# Patient Record
Sex: Male | Born: 1975 | Race: White | Hispanic: No | Marital: Married | Smoking: Former smoker
Health system: Southern US, Community
[De-identification: ages and names within clinical notes are randomized; demographics above are authoritative.]

## PROBLEM LIST (undated history)

## (undated) DIAGNOSIS — I509 Heart failure, unspecified: Secondary | ICD-10-CM

## (undated) DIAGNOSIS — I2510 Atherosclerotic heart disease of native coronary artery without angina pectoris: Secondary | ICD-10-CM

## (undated) DIAGNOSIS — E785 Hyperlipidemia, unspecified: Secondary | ICD-10-CM

## (undated) DIAGNOSIS — E119 Type 2 diabetes mellitus without complications: Secondary | ICD-10-CM

## (undated) DIAGNOSIS — K219 Gastro-esophageal reflux disease without esophagitis: Secondary | ICD-10-CM

## (undated) HISTORY — DX: Morbid (severe) obesity due to excess calories: E66.01

## (undated) HISTORY — PX: CORONARY ANGIOPLASTY WITH STENT PLACEMENT: SHX49

## (undated) HISTORY — DX: Hyperlipidemia, unspecified: E78.5

## (undated) HISTORY — DX: Type 2 diabetes mellitus without complications: E11.9

## (undated) HISTORY — DX: Gastro-esophageal reflux disease without esophagitis: K21.9

## (undated) HISTORY — DX: Atherosclerotic heart disease of native coronary artery without angina pectoris: I25.10

## (undated) HISTORY — DX: Heart failure, unspecified: I50.9

---

## 1997-12-13 ENCOUNTER — Emergency Department (HOSPITAL_COMMUNITY): Admission: EM | Admit: 1997-12-13 | Discharge: 1997-12-13 | Payer: Self-pay | Admitting: Emergency Medicine

## 1999-01-25 ENCOUNTER — Emergency Department (HOSPITAL_COMMUNITY): Admission: EM | Admit: 1999-01-25 | Discharge: 1999-01-25 | Payer: Self-pay | Admitting: Emergency Medicine

## 1999-01-25 ENCOUNTER — Encounter: Payer: Self-pay | Admitting: *Deleted

## 2000-05-08 ENCOUNTER — Encounter: Payer: Self-pay | Admitting: Emergency Medicine

## 2000-05-08 ENCOUNTER — Emergency Department (HOSPITAL_COMMUNITY): Admission: EM | Admit: 2000-05-08 | Discharge: 2000-05-08 | Payer: Self-pay | Admitting: Emergency Medicine

## 2001-09-02 ENCOUNTER — Emergency Department (HOSPITAL_COMMUNITY): Admission: EM | Admit: 2001-09-02 | Discharge: 2001-09-02 | Payer: Self-pay | Admitting: *Deleted

## 2003-11-07 ENCOUNTER — Emergency Department (HOSPITAL_COMMUNITY): Admission: AD | Admit: 2003-11-07 | Discharge: 2003-11-07 | Payer: Self-pay | Admitting: Family Medicine

## 2004-05-14 ENCOUNTER — Emergency Department (HOSPITAL_COMMUNITY): Admission: EM | Admit: 2004-05-14 | Discharge: 2004-05-14 | Payer: Self-pay | Admitting: Family Medicine

## 2004-10-29 ENCOUNTER — Emergency Department (HOSPITAL_COMMUNITY): Admission: EM | Admit: 2004-10-29 | Discharge: 2004-10-29 | Payer: Self-pay | Admitting: Family Medicine

## 2005-07-02 ENCOUNTER — Ambulatory Visit: Payer: Self-pay | Admitting: Family Medicine

## 2006-07-18 ENCOUNTER — Emergency Department (HOSPITAL_COMMUNITY): Admission: EM | Admit: 2006-07-18 | Discharge: 2006-07-18 | Payer: Self-pay | Admitting: Emergency Medicine

## 2007-06-23 ENCOUNTER — Emergency Department (HOSPITAL_COMMUNITY): Admission: EM | Admit: 2007-06-23 | Discharge: 2007-06-23 | Payer: Self-pay | Admitting: Emergency Medicine

## 2007-07-10 ENCOUNTER — Emergency Department (HOSPITAL_COMMUNITY): Admission: EM | Admit: 2007-07-10 | Discharge: 2007-07-10 | Payer: Self-pay | Admitting: Emergency Medicine

## 2007-09-03 ENCOUNTER — Ambulatory Visit: Payer: Self-pay | Admitting: Cardiology

## 2007-09-04 ENCOUNTER — Inpatient Hospital Stay (HOSPITAL_COMMUNITY): Admission: EM | Admit: 2007-09-04 | Discharge: 2007-09-05 | Payer: Self-pay | Admitting: Emergency Medicine

## 2007-09-04 ENCOUNTER — Ambulatory Visit: Payer: Self-pay | Admitting: Cardiology

## 2007-09-04 ENCOUNTER — Ambulatory Visit: Payer: Self-pay | Admitting: Internal Medicine

## 2007-09-04 ENCOUNTER — Encounter: Payer: Self-pay | Admitting: Internal Medicine

## 2007-09-05 ENCOUNTER — Encounter: Payer: Self-pay | Admitting: Internal Medicine

## 2007-09-08 ENCOUNTER — Telehealth: Payer: Self-pay | Admitting: Internal Medicine

## 2007-09-09 ENCOUNTER — Encounter: Payer: Self-pay | Admitting: Internal Medicine

## 2007-09-18 ENCOUNTER — Ambulatory Visit: Payer: Self-pay | Admitting: Internal Medicine

## 2007-09-21 ENCOUNTER — Ambulatory Visit: Payer: Self-pay | Admitting: Internal Medicine

## 2007-09-21 LAB — CONVERTED CEMR LAB
BUN: 12 mg/dL (ref 6–23)
CO2: 26 meq/L (ref 19–32)
GFR calc Af Amer: 145 mL/min
GFR calc non Af Amer: 120 mL/min
Glucose, Bld: 279 mg/dL — ABNORMAL HIGH (ref 70–99)
Potassium: 4.3 meq/L (ref 3.5–5.1)

## 2007-09-30 ENCOUNTER — Ambulatory Visit: Payer: Self-pay | Admitting: Internal Medicine

## 2007-09-30 ENCOUNTER — Ambulatory Visit (HOSPITAL_COMMUNITY): Admission: RE | Admit: 2007-09-30 | Discharge: 2007-09-30 | Payer: Self-pay | Admitting: Internal Medicine

## 2007-10-14 ENCOUNTER — Ambulatory Visit: Payer: Self-pay | Admitting: Internal Medicine

## 2007-10-14 LAB — CONVERTED CEMR LAB
BUN: 11 mg/dL (ref 6–23)
Basophils Absolute: 0.1 10*3/uL (ref 0.0–0.1)
Eosinophils Absolute: 0.2 10*3/uL (ref 0.0–0.6)
GFR calc Af Amer: 127 mL/min
GFR calc non Af Amer: 105 mL/min
Hemoglobin: 16.6 g/dL (ref 13.0–17.0)
Lymphocytes Relative: 28.4 % (ref 12.0–46.0)
MCHC: 34 g/dL (ref 30.0–36.0)
Monocytes Absolute: 0.6 10*3/uL (ref 0.2–0.7)
Monocytes Relative: 8.9 % (ref 3.0–11.0)
Neutro Abs: 4 10*3/uL (ref 1.4–7.7)
Potassium: 4 meq/L (ref 3.5–5.1)
aPTT: 28 s (ref 21.7–29.8)

## 2007-10-23 ENCOUNTER — Inpatient Hospital Stay (HOSPITAL_BASED_OUTPATIENT_CLINIC_OR_DEPARTMENT_OTHER): Admission: RE | Admit: 2007-10-23 | Discharge: 2007-10-23 | Payer: Self-pay | Admitting: Internal Medicine

## 2007-10-23 ENCOUNTER — Ambulatory Visit: Payer: Self-pay | Admitting: Internal Medicine

## 2007-10-23 ENCOUNTER — Inpatient Hospital Stay (HOSPITAL_COMMUNITY): Admission: AD | Admit: 2007-10-23 | Discharge: 2007-10-25 | Payer: Self-pay | Admitting: Internal Medicine

## 2007-10-27 ENCOUNTER — Ambulatory Visit: Payer: Self-pay | Admitting: Surgery

## 2007-11-12 ENCOUNTER — Ambulatory Visit: Payer: Self-pay | Admitting: Internal Medicine

## 2007-11-12 LAB — CONVERTED CEMR LAB
Basophils Absolute: 0 10*3/uL (ref 0.0–0.1)
CO2: 28 meq/L (ref 19–32)
Calcium: 8.9 mg/dL (ref 8.4–10.5)
GFR calc Af Amer: 145 mL/min
Hemoglobin: 16.8 g/dL (ref 13.0–17.0)
INR: 1 (ref 0.8–1.0)
Lymphocytes Relative: 29.9 % (ref 12.0–46.0)
MCHC: 34.2 g/dL (ref 30.0–36.0)
Neutro Abs: 3.4 10*3/uL (ref 1.4–7.7)
RDW: 11.9 % (ref 11.5–14.6)
Sodium: 134 meq/L — ABNORMAL LOW (ref 135–145)
aPTT: 25.9 s (ref 21.7–29.8)

## 2007-11-19 ENCOUNTER — Ambulatory Visit: Payer: Self-pay | Admitting: Cardiovascular Disease

## 2007-11-19 ENCOUNTER — Inpatient Hospital Stay (HOSPITAL_COMMUNITY): Admission: AD | Admit: 2007-11-19 | Discharge: 2007-11-20 | Payer: Self-pay | Admitting: Cardiovascular Disease

## 2007-12-07 ENCOUNTER — Encounter: Payer: Self-pay | Admitting: Internal Medicine

## 2007-12-31 ENCOUNTER — Ambulatory Visit: Payer: Self-pay | Admitting: Internal Medicine

## 2008-01-30 ENCOUNTER — Emergency Department (HOSPITAL_COMMUNITY): Admission: EM | Admit: 2008-01-30 | Discharge: 2008-01-30 | Payer: Self-pay | Admitting: Emergency Medicine

## 2008-02-01 ENCOUNTER — Emergency Department (HOSPITAL_COMMUNITY): Admission: EM | Admit: 2008-02-01 | Discharge: 2008-02-01 | Payer: Self-pay | Admitting: Family Medicine

## 2008-04-27 ENCOUNTER — Ambulatory Visit: Payer: Self-pay | Admitting: Internal Medicine

## 2008-04-27 ENCOUNTER — Encounter: Payer: Self-pay | Admitting: Internal Medicine

## 2008-04-27 LAB — CONVERTED CEMR LAB
Basophils Absolute: 0.1 10*3/uL (ref 0.0–0.1)
Bilirubin, Direct: 0.1 mg/dL (ref 0.0–0.3)
Calcium: 9.4 mg/dL (ref 8.4–10.5)
Cholesterol: 159 mg/dL (ref 0–200)
Direct LDL: 91.5 mg/dL
Eosinophils Absolute: 0.4 10*3/uL (ref 0.0–0.7)
GFR calc Af Amer: 169 mL/min
HCT: 48 % (ref 39.0–52.0)
Hemoglobin: 17.1 g/dL — ABNORMAL HIGH (ref 13.0–17.0)
MCHC: 35.7 g/dL (ref 30.0–36.0)
MCV: 96.1 fL (ref 78.0–100.0)
Monocytes Absolute: 0.5 10*3/uL (ref 0.1–1.0)
Neutro Abs: 4.6 10*3/uL (ref 1.4–7.7)
RDW: 11.7 % (ref 11.5–14.6)
Sodium: 138 meq/L (ref 135–145)
Total Bilirubin: 0.8 mg/dL (ref 0.3–1.2)
VLDL: 77 mg/dL — ABNORMAL HIGH (ref 0–40)

## 2008-07-18 ENCOUNTER — Ambulatory Visit: Payer: Self-pay | Admitting: Internal Medicine

## 2008-08-03 ENCOUNTER — Ambulatory Visit: Payer: Self-pay

## 2008-08-03 ENCOUNTER — Encounter: Payer: Self-pay | Admitting: Internal Medicine

## 2008-08-03 ENCOUNTER — Ambulatory Visit: Payer: Self-pay | Admitting: Internal Medicine

## 2008-08-03 LAB — CONVERTED CEMR LAB
ALT: 39 units/L (ref 0–53)
Albumin: 3.8 g/dL (ref 3.5–5.2)
BUN: 9 mg/dL (ref 6–23)
CO2: 26 meq/L (ref 19–32)
Calcium: 9.1 mg/dL (ref 8.4–10.5)
Cholesterol: 172 mg/dL (ref 0–200)
Creatinine, Ser: 0.8 mg/dL (ref 0.4–1.5)
Direct LDL: 109.7 mg/dL
GFR calc non Af Amer: 120 mL/min
Hgb A1c MFr Bld: 9.9 % — ABNORMAL HIGH (ref 4.6–6.0)
Total Bilirubin: 0.8 mg/dL (ref 0.3–1.2)
Total CHOL/HDL Ratio: 6.3
Triglycerides: 213 mg/dL (ref 0–149)
VLDL: 43 mg/dL — ABNORMAL HIGH (ref 0–40)

## 2008-08-04 ENCOUNTER — Encounter: Payer: Self-pay | Admitting: Internal Medicine

## 2008-09-05 ENCOUNTER — Telehealth: Payer: Self-pay | Admitting: Internal Medicine

## 2008-10-07 DIAGNOSIS — E785 Hyperlipidemia, unspecified: Secondary | ICD-10-CM | POA: Insufficient documentation

## 2008-10-07 DIAGNOSIS — I120 Hypertensive chronic kidney disease with stage 5 chronic kidney disease or end stage renal disease: Secondary | ICD-10-CM

## 2008-10-07 DIAGNOSIS — I2510 Atherosclerotic heart disease of native coronary artery without angina pectoris: Secondary | ICD-10-CM | POA: Insufficient documentation

## 2008-10-07 DIAGNOSIS — I5022 Chronic systolic (congestive) heart failure: Secondary | ICD-10-CM

## 2008-12-07 ENCOUNTER — Encounter: Payer: Self-pay | Admitting: Internal Medicine

## 2008-12-07 ENCOUNTER — Ambulatory Visit: Payer: Self-pay | Admitting: Internal Medicine

## 2008-12-07 DIAGNOSIS — I10 Essential (primary) hypertension: Secondary | ICD-10-CM | POA: Insufficient documentation

## 2008-12-07 DIAGNOSIS — F329 Major depressive disorder, single episode, unspecified: Secondary | ICD-10-CM

## 2008-12-08 LAB — CONVERTED CEMR LAB
ALT: 40 units/L (ref 0–53)
AST: 31 units/L (ref 0–37)
Albumin: 4 g/dL (ref 3.5–5.2)
Alkaline Phosphatase: 70 units/L (ref 39–117)
BUN: 11 mg/dL (ref 6–23)
Bilirubin, Direct: 0 mg/dL (ref 0.0–0.3)
Calcium: 9.2 mg/dL (ref 8.4–10.5)
Cholesterol: 138 mg/dL (ref 0–200)
Creatinine, Ser: 0.7 mg/dL (ref 0.4–1.5)
GFR calc non Af Amer: 138.68 mL/min (ref >60)
Glucose, Bld: 284 mg/dL — ABNORMAL HIGH (ref 70–99)
Potassium: 3.8 meq/L (ref 3.5–5.1)
Total Protein: 7.3 g/dL (ref 6.0–8.3)
VLDL: 60 mg/dL — ABNORMAL HIGH (ref 0.0–40.0)

## 2009-01-02 ENCOUNTER — Telehealth: Payer: Self-pay | Admitting: Internal Medicine

## 2009-04-11 ENCOUNTER — Ambulatory Visit: Payer: Self-pay | Admitting: Internal Medicine

## 2009-07-06 ENCOUNTER — Telehealth: Payer: Self-pay | Admitting: Internal Medicine

## 2009-10-09 ENCOUNTER — Emergency Department (HOSPITAL_COMMUNITY): Admission: EM | Admit: 2009-10-09 | Discharge: 2009-10-09 | Payer: Self-pay | Admitting: Emergency Medicine

## 2009-11-10 ENCOUNTER — Ambulatory Visit: Payer: Self-pay | Admitting: Internal Medicine

## 2009-12-04 ENCOUNTER — Encounter: Payer: Self-pay | Admitting: Internal Medicine

## 2009-12-04 ENCOUNTER — Ambulatory Visit: Payer: Self-pay | Admitting: Cardiology

## 2009-12-04 ENCOUNTER — Ambulatory Visit: Payer: Self-pay | Admitting: Internal Medicine

## 2009-12-04 ENCOUNTER — Ambulatory Visit (HOSPITAL_COMMUNITY): Admission: RE | Admit: 2009-12-04 | Discharge: 2009-12-04 | Payer: Self-pay | Admitting: Internal Medicine

## 2009-12-04 ENCOUNTER — Ambulatory Visit: Payer: Self-pay

## 2009-12-11 ENCOUNTER — Telehealth: Payer: Self-pay | Admitting: Internal Medicine

## 2009-12-12 LAB — CONVERTED CEMR LAB
Albumin: 3.5 g/dL (ref 3.5–5.2)
BUN: 10 mg/dL (ref 6–23)
Calcium: 9.2 mg/dL (ref 8.4–10.5)
Cholesterol: 201 mg/dL — ABNORMAL HIGH (ref 0–200)
Creatinine, Ser: 0.7 mg/dL (ref 0.4–1.5)
GFR calc non Af Amer: 137.83 mL/min (ref >60)
Glucose, Bld: 386 mg/dL — ABNORMAL HIGH (ref 70–99)
Total Bilirubin: 0.4 mg/dL (ref 0.3–1.2)
Total CHOL/HDL Ratio: 7
Triglycerides: 805 mg/dL — ABNORMAL HIGH (ref 0.0–149.0)
VLDL: 161 mg/dL — ABNORMAL HIGH (ref 0.0–40.0)

## 2010-06-15 ENCOUNTER — Encounter: Payer: Self-pay | Admitting: Cardiology

## 2010-06-18 ENCOUNTER — Ambulatory Visit: Payer: Self-pay | Admitting: Cardiology

## 2010-06-18 ENCOUNTER — Encounter: Payer: Self-pay | Admitting: Physician Assistant

## 2010-06-18 DIAGNOSIS — R109 Unspecified abdominal pain: Secondary | ICD-10-CM

## 2010-06-18 DIAGNOSIS — E1149 Type 2 diabetes mellitus with other diabetic neurological complication: Secondary | ICD-10-CM | POA: Insufficient documentation

## 2010-06-19 LAB — CONVERTED CEMR LAB
ALT: 25 units/L (ref 0–53)
AST: 22 units/L (ref 0–37)
Albumin: 3.6 g/dL (ref 3.5–5.2)
BUN: 10 mg/dL (ref 6–23)
Cholesterol: 235 mg/dL — ABNORMAL HIGH (ref 0–200)
Creatinine, Ser: 0.6 mg/dL (ref 0.4–1.5)
GFR calc non Af Amer: 158.04 mL/min (ref >60)
Glucose, Bld: 321 mg/dL — ABNORMAL HIGH (ref 70–99)
Potassium: 3.9 meq/L (ref 3.5–5.1)
Total Bilirubin: 1.1 mg/dL (ref 0.3–1.2)
Total CHOL/HDL Ratio: 8
Triglycerides: 490 mg/dL — ABNORMAL HIGH (ref 0.0–149.0)
VLDL: 98 mg/dL — ABNORMAL HIGH (ref 0.0–40.0)

## 2010-06-27 ENCOUNTER — Ambulatory Visit: Payer: Self-pay | Admitting: Internal Medicine

## 2010-06-27 DIAGNOSIS — A630 Anogenital (venereal) warts: Secondary | ICD-10-CM

## 2010-06-27 DIAGNOSIS — E8881 Metabolic syndrome: Secondary | ICD-10-CM

## 2010-06-27 LAB — CONVERTED CEMR LAB
ALT: 26 units/L (ref 0–53)
AST: 18 units/L (ref 0–37)
Albumin: 3.9 g/dL (ref 3.5–5.2)
Basophils Relative: 1.3 % (ref 0.0–3.0)
Bilirubin Urine: NEGATIVE
Chloride: 101 meq/L (ref 96–112)
Cholesterol, target level: 200 mg/dL
Eosinophils Relative: 3.2 % (ref 0.0–5.0)
GFR calc non Af Amer: 170.66 mL/min (ref >60)
HCT: 49.9 % (ref 39.0–52.0)
Hemoglobin: 17.9 g/dL — ABNORMAL HIGH (ref 13.0–17.0)
LDL Goal: 70 mg/dL
Lymphs Abs: 1.8 10*3/uL (ref 0.7–4.0)
MCV: 94.9 fL (ref 78.0–100.0)
Monocytes Absolute: 0.5 10*3/uL (ref 0.1–1.0)
Monocytes Relative: 6.5 % (ref 3.0–12.0)
Neutro Abs: 4.9 10*3/uL (ref 1.4–7.7)
Nitrite: NEGATIVE
Potassium: 4.4 meq/L (ref 3.5–5.1)
Sodium: 135 meq/L (ref 135–145)
TSH: 0.58 microintl units/mL (ref 0.35–5.50)
Total Bilirubin: 1 mg/dL (ref 0.3–1.2)
Total Protein, Urine: 30 mg/dL
Total Protein: 6.8 g/dL (ref 6.0–8.3)
Urine Glucose: >=1000 mg/dL
WBC: 7.5 10*3/uL (ref 4.5–10.5)
pH: 6.5 (ref 5.0–8.0)

## 2010-06-28 ENCOUNTER — Encounter: Payer: Self-pay | Admitting: Internal Medicine

## 2010-07-03 ENCOUNTER — Encounter: Payer: Self-pay | Admitting: Internal Medicine

## 2010-07-04 LAB — HM DIABETES EYE EXAM

## 2010-07-05 ENCOUNTER — Telehealth: Payer: Self-pay | Admitting: Internal Medicine

## 2010-07-17 ENCOUNTER — Telehealth: Payer: Self-pay | Admitting: Internal Medicine

## 2010-07-17 ENCOUNTER — Encounter: Payer: Self-pay | Admitting: Internal Medicine

## 2010-07-24 ENCOUNTER — Ambulatory Visit: Payer: Self-pay | Admitting: Internal Medicine

## 2010-07-24 DIAGNOSIS — J019 Acute sinusitis, unspecified: Secondary | ICD-10-CM

## 2010-08-23 ENCOUNTER — Other Ambulatory Visit: Payer: Self-pay | Admitting: Internal Medicine

## 2010-08-23 ENCOUNTER — Ambulatory Visit
Admission: RE | Admit: 2010-08-23 | Discharge: 2010-08-23 | Payer: Self-pay | Source: Home / Self Care | Attending: Internal Medicine | Admitting: Internal Medicine

## 2010-08-23 LAB — BASIC METABOLIC PANEL
BUN: 9 mg/dL (ref 6–23)
CO2: 26 mEq/L (ref 19–32)
Calcium: 9.2 mg/dL (ref 8.4–10.5)
Chloride: 101 mEq/L (ref 96–112)
Creatinine, Ser: 0.7 mg/dL (ref 0.4–1.5)
GFR: 137.24 mL/min (ref >60.00)
Glucose, Bld: 313 mg/dL — ABNORMAL HIGH (ref 70–99)
Potassium: 4.3 mEq/L (ref 3.5–5.1)
Sodium: 136 mEq/L (ref 135–145)

## 2010-08-23 LAB — LIPID PANEL
Cholesterol: 226 mg/dL — ABNORMAL HIGH (ref 0–200)
HDL: 31 mg/dL — ABNORMAL LOW (ref >39.00)
Total CHOL/HDL Ratio: 7
Triglycerides: 238 mg/dL — ABNORMAL HIGH (ref 0.0–149.0)
VLDL: 47.6 mg/dL — ABNORMAL HIGH (ref 0.0–40.0)

## 2010-08-23 LAB — HEMOGLOBIN A1C: Hgb A1c MFr Bld: 10.9 % — ABNORMAL HIGH (ref 4.6–6.5)

## 2010-08-23 LAB — LDL CHOLESTEROL, DIRECT: Direct LDL: 165.5 mg/dL

## 2010-08-24 ENCOUNTER — Telehealth: Payer: Self-pay | Admitting: Internal Medicine

## 2010-08-24 ENCOUNTER — Encounter: Payer: Self-pay | Admitting: Internal Medicine

## 2010-09-04 ENCOUNTER — Ambulatory Visit
Admission: RE | Admit: 2010-09-04 | Discharge: 2010-09-04 | Payer: Self-pay | Source: Home / Self Care | Attending: Internal Medicine | Admitting: Internal Medicine

## 2010-09-04 DIAGNOSIS — M79609 Pain in unspecified limb: Secondary | ICD-10-CM | POA: Insufficient documentation

## 2010-09-04 LAB — HM DIABETES FOOT EXAM

## 2010-09-06 ENCOUNTER — Telehealth: Payer: Self-pay | Admitting: Internal Medicine

## 2010-09-18 NOTE — Progress Notes (Signed)
Summary: question on meds  Phone Note Call from Patient Call back at [phone]   Caller: Patient Reason for Call: Talk to Nurse Summary of Call: question on meds Initial call taken by: Roe Coombs,  July 17, 2010 1:44 PM  Follow-up for Phone Call        when pt saw Tereso Newcomer Simvastatin was changed to Crestor and he can not afford, will apply for asst. program, also needs to reaply for Plavix asst. program, samples of crestor and plavix and applications all left at front desk for pt to complete, also needed a refill on ramipril sent to [facility] Follow-up by: Meredith Staggers, RN,  July 17, 2010 2:17 PM    Prescriptions: CRESTOR 20 MG TABS (ROSUVASTATIN CALCIUM) Take one tablet by mouth daily.  #35 x 0   Entered by:   Meredith Staggers, RN   Authorized by:   Dolores Patty, MD, [facility]   Signed by:   Meredith Staggers, RN on 07/17/2010   Method used:   Samples Given   RxID:   [id number] PLAVIX 75 MG TABS (CLOPIDOGREL BISULFATE) Take one tablet by mouth daily  #12 x 0   Entered by:   Meredith Staggers, RN   Authorized by:   Dolores Patty, MD, [facility]   Signed by:   Meredith Staggers, RN on 07/17/2010   Method used:   Samples Given   RxID:   [id number] ALTACE 10 MG CAPS (RAMIPRIL) 1 by mouth once daily  #30 x 12   Entered by:   Meredith Staggers, RN   Authorized by:   Dolores Patty, MD, [facility]   Signed by:   Meredith Staggers, RN on 07/17/2010   Method used:   Electronically to        [facility] [id number]* (retail)       [address]       [place], [place]  [address]       Ph: [phone]       Fax: [phone]   RxID:   [id number]   Appended Document: question on meds    Clinical Lists Changes  Medications: Rx of CRESTOR 20 MG TABS (ROSUVASTATIN CALCIUM) Take one tablet by mouth daily.;  #90 x 3;  Signed;  Entered by: Meredith Staggers, RN;  Authorized by: Dolores Patty, MD, [facility];  Method used: Printed then faxed to [facility]  [address]  [id number] [address], [place], [place], [place]  [address], Ph: [phone], Fax: [phone]    Prescriptions: CRESTOR 20 MG TABS (ROSUVASTATIN CALCIUM) Take one tablet by mouth daily.  #90 x 3   Entered by:   Meredith Staggers, RN   Authorized by:   Dolores Patty, MD, [facility]   Signed by:   Meredith Staggers, RN on 07/18/2010   Method used:   Printed then faxed to ...       [facility]  [id number] (retail)       [address]       [place], [place]  [address]       Ph: [phone]       Fax: [phone]   RxID:   [id number]  prescription sent to [facility] & me program Meredith Staggers, RN  July 18, 2010 5:06 PM

## 2010-09-18 NOTE — Letter (Signed)
Summary: [facility]  [facility]   Imported By: Lester Miranda 07/10/2010 07:26:27  _____________________________________________________________________  External Attachment:    Type:   Image     Comment:   External Document

## 2010-09-18 NOTE — Assessment & Plan Note (Signed)
Summary: cough/sore throat/cd   Vital Signs:  Patient profile:   35 year old male Height:      74 inches Weight:      301.25 pounds BMI:     38.82 O2 Sat:      98 % on Room air Temp:     98.4 degrees F oral Pulse rate:   80 / minute Pulse rhythm:   regular Resp:     16 per minute BP sitting:   110 / 84  (left arm) Cuff size:   large  Vitals Entered By: Rock Nephew CMA (July 24, 2010 9:01 AM)  Nutrition Counseling: Patient's BMI is greater than 25 and therefore counseled on weight management options.  O2 Flow:  Room air CC: Pt c/o cough, congestion, facial pain x 1wk, URI symptoms, Hypertension Management Is Patient Diabetic? Yes Did you bring your meter with you today? No Pain Assessment Patient in pain? no       Does patient need assistance? Functional Status Self care Ambulation Normal   Primary Care Provider:  Etta Grandchild MD  CC:  Pt c/o cough, congestion, facial pain x 1wk, URI symptoms, and Hypertension Management.  History of Present Illness:  URI Symptoms      This is a 35 year old man who presents with URI symptoms.  The symptoms began 2 weeks ago.  The severity is described as moderate.  The patient reports nasal congestion, purulent nasal discharge, and sore throat, but denies dry cough, productive cough, earache, and sick contacts.  The patient denies fever, stiff neck, dyspnea, wheezing, rash, vomiting, diarrhea, use of an antipyretic, and response to antipyretic.  The patient also reports severe fatigue.  The patient denies itchy throat, sneezing, headache, and muscle aches.  Risk factors for Strep sinusitis include unilateral facial pain, unilateral nasal discharge, poor response to decongestant, and double sickening.  The patient denies the following risk factors for Strep sinusitis: tooth pain, Strep exposure, tender adenopathy, and absence of cough.    Hypertension History:      He denies headache, chest pain, palpitations, dyspnea with  exertion, orthopnea, PND, peripheral edema, visual symptoms, neurologic problems, syncope, and side effects from treatment.  He notes no problems with any antihypertensive medication side effects.        Positive major cardiovascular risk factors include diabetes, hyperlipidemia, and hypertension.  Negative major cardiovascular risk factors include male age less than 72 years old, negative family history for ischemic heart disease, and non-tobacco-user status.        Positive history for target organ damage include ASHD (either angina/prior MI/prior CABG) and cardiac end organ damage (either CHF or LVH).  Further assessment for target organ damage reveals no history of stroke/TIA, peripheral vascular disease, renal insufficiency, or hypertensive retinopathy.     Preventive Screening-Counseling & Management  Alcohol-Tobacco     Alcohol drinks/day: 0     Alcohol Counseling: not indicated; patient does not drink     Smoking Status: quit < 6 months     Tobacco Counseling: to remain off tobacco products  Hep-HIV-STD-Contraception     Hepatitis Risk: no risk noted     HIV Risk: no risk noted     STD Risk: no risk noted     Dental Visit-last 6 months yes     Dental Care Counseling: to seek dental care; no dental care within six months     TSE monthly: yes     Testicular SE Education/Counseling to perform regular STE  Sun Exposure-Excessive: no      Sexual History:  currently monogamous.        Drug Use:  never.        Blood Transfusions:  no.    Clinical Review Panels:  Immunizations   Last Tetanus Booster:  Tdap (06/27/2010)   Last Flu Vaccine:  Fluvax 3+ (06/27/2010)  Lipid Management   Cholesterol:  235 (06/18/2010)   LDL (bad choesterol):  DEL (08/03/2008)   HDL (good cholesterol):  30.40 (06/18/2010)  Diabetes Management   HgBA1C:  11.4 (06/27/2010)   Creatinine:  0.6 (06/27/2010)   Last Dilated Eye Exam:  diabetic retinopathy (07/04/2010)   Last Foot Exam:  yes  (07/24/2010)   Last Flu Vaccine:  Fluvax 3+ (06/27/2010)  CBC   WBC:  7.5 (06/27/2010)   RBC:  5.26 (06/27/2010)   Hgb:  17.9 (06/27/2010)   Hct:  49.9 (06/27/2010)   Platelets:  214.0 (06/27/2010)   MCV  94.9 (06/27/2010)   MCHC  35.9 (06/27/2010)   RDW  12.1 (06/27/2010)   PMN:  64.6 (06/27/2010)   Lymphs:  24.4 (06/27/2010)   Monos:  6.5 (06/27/2010)   Eosinophils:  3.2 (06/27/2010)   Basophil:  1.3 (06/27/2010)  Complete Metabolic Panel   Glucose:  391 (06/27/2010)   Sodium:  135 (06/27/2010)   Potassium:  4.4 (06/27/2010)   Chloride:  101 (06/27/2010)   CO2:  26 (06/27/2010)   BUN:  11 (06/27/2010)   Creatinine:  0.6 (06/27/2010)   Albumin:  3.9 (06/27/2010)   Total Protein:  6.8 (06/27/2010)   Calcium:  10.0 (06/27/2010)   Total Bili:  1.0 (06/27/2010)   Alk Phos:  88 (06/27/2010)   SGPT (ALT):  26 (06/27/2010)   SGOT (AST):  18 (06/27/2010)   Medications Prior to Update: 1)  Carvedilol 25 Mg Tabs (Carvedilol) .... Take Two Tablets By Mouth Twice A Day 2)  Furosemide 40 Mg Tabs (Furosemide) .Marland Kitchen.. 1 Tab By Mouth Two Times A Day 3)  Altace 10 Mg Caps (Ramipril) .Marland Kitchen.. 1 By Mouth Once Daily 4)  Digoxin 0.25 Mg Tabs (Digoxin) .Marland Kitchen.. 1 Tab By Mouth Once Daily 5)  Klor-Con M20 20 Meq Cr-Tabs (Potassium Chloride Crys Cr) .Marland Kitchen.. 1 Tab By Mouth Once Daily 6)  Aspirin Ec 325 Mg Tbec (Aspirin) .... Take One Tablet By Mouth Daily 7)  Spironolactone 25 Mg Tabs (Spironolactone) .... Take 1/2 Tablet By Mouth Two Times A Day 8)  Plavix 75 Mg Tabs (Clopidogrel Bisulfate) .... Take One Tablet By Mouth Daily 9)  Tricor 145 Mg Tabs (Fenofibrate) .Marland Kitchen.. 1 Tab By Mouth At Bedtime 10)  Fish Oil   Oil (Fish Oil) .... 2000mg  Daily 11)  Allegra 180 Mg Tabs (Fexofenadine Hcl) .... As Needed 12)  Prevacid 15 Mg Cpdr (Lansoprazole) .... Once Daily As Needed 13)  Crestor 20 Mg Tabs (Rosuvastatin Calcium) .... Take One Tablet By Mouth Daily. 14)  Cymbalta 30 Mg Cpep (Duloxetine Hcl) .... One By Mouth  Once Daily For Depresion 15)  Contour Blood Glucose System W/device Kit (Blood Glucose Monitoring Suppl) .... Use Two Times A Day As Directed 16)  Levemir Flexpen 100 Unit/ml Soln (Insulin Detemir) .... 50 Units Once Daily  Current Medications (verified): 1)  Carvedilol 25 Mg Tabs (Carvedilol) .... Take Two Tablets By Mouth Twice A Day 2)  Furosemide 40 Mg Tabs (Furosemide) .Marland Kitchen.. 1 Tab By Mouth Two Times A Day 3)  Altace 10 Mg Caps (Ramipril) .Marland Kitchen.. 1 By Mouth Once Daily 4)  Digoxin 0.25 Mg  Tabs (Digoxin) .Marland Kitchen.. 1 Tab By Mouth Once Daily 5)  Klor-Con M20 20 Meq Cr-Tabs (Potassium Chloride Crys Cr) .Marland Kitchen.. 1 Tab By Mouth Once Daily 6)  Aspirin Ec 325 Mg Tbec (Aspirin) .... Take One Tablet By Mouth Daily 7)  Spironolactone 25 Mg Tabs (Spironolactone) .... Take 1/2 Tablet By Mouth Two Times A Day 8)  Plavix 75 Mg Tabs (Clopidogrel Bisulfate) .... Take One Tablet By Mouth Daily 9)  Fish Oil   Oil (Fish Oil) .... 2000mg  Daily 10)  Allegra 180 Mg Tabs (Fexofenadine Hcl) .... As Needed 11)  Prevacid 15 Mg Cpdr (Lansoprazole) .... Once Daily As Needed 12)  Crestor 20 Mg Tabs (Rosuvastatin Calcium) .... Take One Tablet By Mouth Daily. 13)  Cymbalta 30 Mg Cpep (Duloxetine Hcl) .... One By Mouth Once Daily For Depresion 14)  Contour Blood Glucose System W/device Kit (Blood Glucose Monitoring Suppl) .... Use Two Times A Day As Directed 15)  Levemir Flexpen 100 Unit/ml Soln (Insulin Detemir) .... 50 Units Once Daily 16)  Ceftin 500 Mg Tab (Cefuroxime Axetil) .... Take One (1) Tablet By Mouth Two (2) Times A Day X 10 Days 17)  Mytussin Ac 100-10 Mg/5ml Syrp (Guaifenesin-Codeine) .... 5-10 Ml By Mouth Qid As Needed For Cough  Allergies (verified): 1)  ! Metformin Hcl  Past History:  Past Medical History: Last updated: 04/11/2009 1. Congestive heart failure secondary to ischemic cardiomyopathy          a. ECHO 12/09: EF 45-50% (previously 20%) 2. 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 s/p stent  LAD 3. Morbid obesity 4. Diabetes.  5. Hyperlipidemia 6. GERD  Past Surgical History: Last updated: 06/27/2010 PTCA/stent  Family History: Last updated: 10/07/2008 Both of his parents are alive.  His mother is currently   age 31.  She is status post CABG at age 80.  She also has a history of   hyperlipidemia.  Father has a history of hypertension, diabetes as well   as VT and is status post ICD placement.  Denies any prior history of   coronary disease.  He has one brother and sister, both are alive and   well.   Social History: Last updated: 10/07/2008 Single  Tobacco Use - Yes.  Alcohol Use - no Regular Exercise - no Drug Use - yes  Risk Factors: Alcohol Use: 0 (07/24/2010)  Risk Factors: Smoking Status: quit < 6 months (07/24/2010)  Family History: Reviewed history from 10/07/2008 and no changes required. Both of his parents are alive.  His mother is currently   age 60.  She is status post CABG at age 3.  She also has a history of   hyperlipidemia.  Father has a history of hypertension, diabetes as well   as VT and is status post ICD placement.  Denies any prior history of   coronary disease.  He has one brother and sister, both are alive and   well.   Social History: Reviewed history from 10/07/2008 and no changes required. Single  Tobacco Use - Yes.  Alcohol Use - no Regular Exercise - no Drug Use - yes  Review of Systems  The patient denies anorexia, fever, weight loss, weight gain, decreased hearing, hoarseness, chest pain, syncope, dyspnea on exertion, peripheral edema, prolonged cough, headaches, hemoptysis, abdominal pain, hematuria, muscle weakness, suspicious skin lesions, and enlarged lymph nodes.    Physical Exam  General:  alert, well-developed, well-nourished, well-hydrated, appropriate dress, normal appearance, healthy-appearing,  cooperative to examination, good hygiene, and overweight-appearing.   Head:  normocephalic, atraumatic, no  abnormalities observed, and no abnormalities palpated.   Eyes:  No corneal or conjunctival inflammation noted. EOMI. Perrla. Funduscopic exam benign, without hemorrhages, exudates or papilledema. Vision grossly normal. no icterus. Ears:  R ear normal and L ear normal.   Nose:  no external deformity, no mucosal edema, no airflow obstruction, no intranasal foreign body, no nasal polyps, no nasal mucosal lesions, no mucosal friability, no septum abnormalities, nasal dischargemucosal pallor, L maxillary sinus tenderness, and R maxillary sinus tenderness.   Mouth:  Oral mucosa and oropharynx without lesions or exudates.  Teeth in good repair. Neck:  supple, full ROM, no masses, no thyromegaly, no thyroid nodules or tenderness, no JVD, normal carotid upstroke, no carotid bruits, no cervical lymphadenopathy, and no neck tenderness.   Lungs:  normal respiratory effort, no intercostal retractions, no accessory muscle use, normal breath sounds, no dullness, no fremitus, no crackles, and no wheezes.   Heart:  normal rate, regular rhythm, no murmur, no gallop, no rub, and no JVD.   Abdomen:  soft, non-tender, normal bowel sounds, no distention, no masses, no guarding, no rigidity, no rebound tenderness, no abdominal hernia, no inguinal hernia, no hepatomegaly, and no splenomegaly.   Msk:  No deformity or scoliosis noted of thoracic or lumbar spine.   Pulses:  R and L carotid,radial,femoral,dorsalis pedis and posterior tibial pulses are full and equal bilaterally Extremities:  trace left pedal edema and trace right pedal edema.   Neurologic:  No cranial nerve deficits noted. Station and gait are normal. Plantar reflexes are down-going bilaterally. DTRs are symmetrical throughout. Sensory, motor and coordinative functions appear intact. Skin:  turgor normal, color normal, no rashes, no suspicious lesions, no ecchymoses, no petechiae, no purpura, no ulcerations, no edema, and tattoo(s).   Cervical Nodes:  no anterior  cervical adenopathy and no posterior cervical adenopathy.   Axillary Nodes:  no R axillary adenopathy and no L axillary adenopathy.   Psych:  Oriented X3, memory intact for recent and remote, normally interactive, good eye contact, not anxious appearing, not depressed appearing, not agitated, and not suicidal.    Diabetes Management Exam:    Foot Exam (with socks and/or shoes not present):       Sensory-Pinprick/Light touch:          Left medial foot (L-4): normal          Left dorsal foot (L-5): normal          Left lateral foot (S-1): normal          Right medial foot (L-4): normal          Right dorsal foot (L-5): normal          Right lateral foot (S-1): normal       Sensory-Monofilament:          Left foot: normal          Right foot: normal       Inspection:          Left foot: normal          Right foot: normal       Nails:          Left foot: normal          Right foot: normal   Impression & Recommendations:  Problem # 1:  SINUSITIS- ACUTE-NOS (ICD-461.9) Assessment New  His updated medication list for this problem includes:    Ceftin  500 Mg Tab (Cefuroxime axetil) .Marland Kitchen... Take one (1) tablet by mouth two (2) times a day x 10 days    Mytussin Ac 100-10 Mg/63ml Syrp (Guaifenesin-codeine) .Marland Kitchen... 5-10 ml by mouth qid as needed for cough  Instructed on treatment. Call if symptoms persist or worsen.   Problem # 2:  DIABETES MELLITUS, TYPE II (ICD-250.00) Assessment: Unchanged  His updated medication list for this problem includes:    Altace 10 Mg Caps (Ramipril) .Marland Kitchen... 1 by mouth once daily    Aspirin Ec 325 Mg Tbec (Aspirin) .Marland Kitchen... Take one tablet by mouth daily    Levemir Flexpen 100 Unit/ml Soln (Insulin detemir) .Marland KitchenMarland KitchenMarland KitchenMarland Kitchen 50 units once daily  Labs Reviewed: Creat: 0.6 (06/27/2010)     Last Eye Exam: diabetic retinopathy (07/04/2010) Reviewed HgBA1c results: 11.4 (06/27/2010)  9.9 (08/03/2008)  Problem # 3:  HYPERTENSION, BENIGN (ICD-401.1) Assessment: Improved  His  updated medication list for this problem includes:    Carvedilol 25 Mg Tabs (Carvedilol) .Marland Kitchen... Take two tablets by mouth twice a day    Furosemide 40 Mg Tabs (Furosemide) .Marland Kitchen... 1 tab by mouth two times a day    Altace 10 Mg Caps (Ramipril) .Marland Kitchen... 1 by mouth once daily    Spironolactone 25 Mg Tabs (Spironolactone) .Marland Kitchen... Take 1/2 tablet by mouth two times a day  BP today: 110/84 Prior BP: 140/80 (06/27/2010)  Prior 10 Yr Risk Heart Disease: N/A (06/27/2010)  Labs Reviewed: K+: 4.4 (06/27/2010) Creat: : 0.6 (06/27/2010)   Chol: 235 (06/18/2010)   HDL: 30.40 (06/18/2010)   LDL: DEL (08/03/2008)   TG: 490.0 Triglyceride is over 400; calculations on Lipids are invalid. mg/dL (81/19/1478)  Complete Medication List: 1)  Carvedilol 25 Mg Tabs (Carvedilol) .... Take two tablets by mouth twice a day 2)  Furosemide 40 Mg Tabs (Furosemide) .Marland Kitchen.. 1 tab by mouth two times a day 3)  Altace 10 Mg Caps (Ramipril) .Marland Kitchen.. 1 by mouth once daily 4)  Digoxin 0.25 Mg Tabs (Digoxin) .Marland Kitchen.. 1 tab by mouth once daily 5)  Klor-con M20 20 Meq Cr-tabs (Potassium chloride crys cr) .Marland Kitchen.. 1 tab by mouth once daily 6)  Aspirin Ec 325 Mg Tbec (Aspirin) .... Take one tablet by mouth daily 7)  Spironolactone 25 Mg Tabs (Spironolactone) .... Take 1/2 tablet by mouth two times a day 8)  Plavix 75 Mg Tabs (Clopidogrel bisulfate) .... Take one tablet by mouth daily 9)  Fish Oil Oil (Fish oil) .... 2000mg  daily 10)  Allegra 180 Mg Tabs (Fexofenadine hcl) .... As needed 11)  Prevacid 15 Mg Cpdr (Lansoprazole) .... Once daily as needed 12)  Crestor 20 Mg Tabs (Rosuvastatin calcium) .... Take one tablet by mouth daily. 13)  Cymbalta 30 Mg Cpep (Duloxetine hcl) .... One by mouth once daily for depresion 14)  Contour Blood Glucose System W/device Kit (Blood glucose monitoring suppl) .... Use two times a day as directed 15)  Levemir Flexpen 100 Unit/ml Soln (Insulin detemir) .... 50 units once daily 16)  Ceftin 500 Mg Tab (Cefuroxime  axetil) .... Take one (1) tablet by mouth two (2) times a day x 10 days 17)  Mytussin Ac 100-10 Mg/68ml Syrp (Guaifenesin-codeine) .... 5-10 ml by mouth qid as needed for cough  Hypertension Assessment/Plan:      The patient's hypertensive risk group is category C: Target organ damage and/or diabetes.  Today's blood pressure is 110/84.  His blood pressure goal is < 130/80.  Patient Instructions: 1)  Please schedule a follow-up appointment in 1 month. 2)  It is important that you exercise regularly at least 20 minutes 5 times a week. If you develop chest pain, have severe difficulty breathing, or feel very tired , stop exercising immediately and seek medical attention. 3)  You need to lose weight. Consider a lower calorie diet and regular exercise.  4)  Check your blood sugars regularly. If your readings are usually above 200 or below 70 you should contact our office. 5)  It is important that your Diabetic A1c level is checked every 3 months. 6)  See your eye doctor yearly to check for diabetic eye damage. 7)  Check your feet each night for sore areas, calluses or signs of infection. 8)  Check your Blood Pressure regularly. If it is above 130/80: you should make an appointment. 9)  Take your antibiotic as prescribed until ALL of it is gone, but stop if you develop a rash or swelling and contact our office as soon as possible. 10)  Acute sinusitis symptoms for less than 10 days are not helped by antibiotics.Use warm moist compresses, and over the counter decongestants ( only as directed). Call if no improvement in 5-7 days, sooner if increasing pain, fever, or new symptoms. Prescriptions: MYTUSSIN AC 100-10 MG/5ML SYRP (GUAIFENESIN-CODEINE) 5-10 ml by mouth QID as needed for cough  #8 ounces x 0   Entered and Authorized by:   Etta Grandchild MD   Signed by:   Etta Grandchild MD on 07/24/2010   Method used:   Print then Give to Patient   RxID:   [id number] CEFTIN 500 MG TAB (CEFUROXIME  AXETIL) Take one (1) tablet by mouth two (2) times a day X 10 days  #20 x 1   Entered and Authorized by:   Etta Grandchild MD   Signed by:   Etta Grandchild MD on 07/24/2010   Method used:   Print then Give to Patient   RxID:   [id number]    Orders Added: 1)  Est. Patient Level IV [44034]

## 2010-09-18 NOTE — Assessment & Plan Note (Signed)
Summary: NEW/ MEDICARE/ [facility]   Vital Signs:  Patient profile:   35 year old male Height:      74 inches Weight:      298 pounds BMI:     38.40 O2 Sat:      98 % on Room air Temp:     97.4 degrees F oral Pulse rate:   79 / minute Pulse rhythm:   regular Resp:     16 per minute BP sitting:   140 / 80  (left arm) Cuff size:   regular  Vitals Entered By: Alysia Penna (June 27, 2010 1:16 PM)  Nutrition Counseling: Patient's BMI is greater than 25 and therefore counseled on weight management options.  O2 Flow:  Room air CC: pt here as a new pt. /cp sma, Lipid Management, Hypertension Management, Preventive Care, Depression Comments pt is also taking Allegra 180 mg as needed    Primary Care Provider:  Etta Grandchild MD  CC:  pt here as a new pt. /cp sma, Lipid Management, Hypertension Management, Preventive Care, and Depression.  History of Present Illness: New to me he has had diarrhea and abd pain for a long time and he thinks it started when he started taking Glucophage.  Depression History:      The patient comes in today for his first follow up visit for depression.  He notes that the symptoms started approximately 07/11/2009.  The patient is having a depressed mood most of the day and has a diminished interest in his usual daily activities.  Positive alarm features for depression include psychomotor retardation, fatigue (loss of energy), and feelings of worthlessness (guilt).  However, he denies significant weight loss, significant weight gain, insomnia, hypersomnia, psychomotor agitation, impaired concentration (indecisiveness), and recurrent thoughts of death or suicide.  The patient denies symptoms of a manic disorder including persistently & abnormally elevated mood, abnormally & persistently irritable mood, less need for sleep, talkative or feels need to keep talking, distractibility, flight of ideas, increase in goal-directed activity, psychomotor agitation, inflated  self-esteem or grandiosity, excessive buying sprees, excessive sexual indiscretions, and excessive foolish business investments.        Psychosocial stress factors include a recent traumatic event and major life changes.  Risk factors for depression include a personal history of depression.  The patient denies that he feels like life is not worth living, denies that he wishes that he were dead, and denies that he has thought about ending his life.         Depression Treatment History:  Prior Medication Used:   Start Date: Assessment of Effect:   Comments:  Zoloft (sertraline)     08/30/2009   no improvement       --  Hypertension History:      He denies headache, chest pain, palpitations, PND, peripheral edema, visual symptoms, neurologic problems, syncope, and side effects from treatment.        Positive major cardiovascular risk factors include diabetes, hyperlipidemia, and hypertension.  Negative major cardiovascular risk factors include male age less than 65 years old, negative family history for ischemic heart disease, and non-tobacco-user status.        Positive history for target organ damage include ASHD (either angina/prior MI/prior CABG) and cardiac end organ damage (either CHF or LVH).  Further assessment for target organ damage reveals no history of stroke/TIA, peripheral vascular disease, renal insufficiency, or hypertensive retinopathy.    Lipid Management History:      Positive NCEP/ATP III  risk factors include diabetes, HDL cholesterol less than 40, hypertension, and ASHD (either angina/prior MI/prior CABG).  Negative NCEP/ATP III risk factors include male age less than 70 years old, no family history for ischemic heart disease, non-tobacco-user status, no prior stroke/TIA, no peripheral vascular disease, and no history of aortic aneurysm.        The patient states that he knows about the "Therapeutic Lifestyle Change" diet.  His compliance with the TLC diet is good.  The patient  expresses understanding of adjunctive measures for cholesterol lowering.  Adjunctive measures started by the patient include aerobic exercise, fiber, limit alcohol consumpton, and weight reduction.  He expresses no side effects from his lipid-lowering medication.  The patient denies any symptoms to suggest myopathy or liver disease.       Preventive Screening-Counseling & Management  Alcohol-Tobacco     Alcohol drinks/day: 0     Alcohol Counseling: not indicated; patient does not drink     Smoking Status: quit < 6 months     Tobacco Counseling: to remain off tobacco products  Hep-HIV-STD-Contraception     Hepatitis Risk: no risk noted     HIV Risk: no risk noted     STD Risk: no risk noted     Dental Visit-last 6 months yes     Dental Care Counseling: to seek dental care; no dental care within six months     TSE monthly: yes     Testicular SE Education/Counseling to perform regular STE     Sun Exposure-Excessive: no      Sexual History:  currently monogamous.        Drug Use:  never.        Blood Transfusions:  no.    Clinical Review Panels:  Immunizations   Last Tetanus Booster:  Tdap (06/27/2010)   Last Flu Vaccine:  Fluvax 3+ (06/27/2010)  Lipid Management   Cholesterol:  235 (06/18/2010)   LDL (bad choesterol):  DEL (08/03/2008)   HDL (good cholesterol):  30.40 (06/18/2010)  Diabetes Management   HgBA1C:  9.9 (08/03/2008)   Creatinine:  0.6 (06/18/2010)   Last Foot Exam:  yes (06/27/2010)   Last Flu Vaccine:  Fluvax 3+ (06/27/2010)  CBC   WBC:  7.3 (04/27/2008)   RBC:  5.00 (04/27/2008)   Hgb:  17.1 (04/27/2008)   Hct:  48.0 (04/27/2008)   Platelets:  190 (04/27/2008)   MCV  96.1 (04/27/2008)   MCHC  35.7 (04/27/2008)   RDW  11.7 (04/27/2008)   PMN:  63.5 (04/27/2008)   Lymphs:  23.6 (04/27/2008)   Monos:  7.2 (04/27/2008)   Eosinophils:  4.8 (04/27/2008)   Basophil:  0.9 (04/27/2008)  Complete Metabolic Panel   Glucose:  315 (06/27/2010)   Sodium:   137 (06/18/2010)   Potassium:  3.9 (06/18/2010)   Chloride:  97 (06/18/2010)   CO2:  25 (06/18/2010)   BUN:  10 (06/18/2010)   Creatinine:  0.6 (06/18/2010)   Albumin:  3.6 (06/18/2010)   Total Protein:  6.5 (06/18/2010)   Calcium:  8.6 (06/18/2010)   Total Bili:  1.1 (06/18/2010)   Alk Phos:  73 (06/18/2010)   SGPT (ALT):  25 (06/18/2010)   SGOT (AST):  22 (06/18/2010)   Medications Prior to Update: 1)  Carvedilol 25 Mg Tabs (Carvedilol) .... Take Two Tablets By Mouth Twice A Day 2)  Furosemide 40 Mg Tabs (Furosemide) .Marland Kitchen.. 1 Tab By Mouth Two Times A Day 3)  Altace 10 Mg Caps (Ramipril) .Marland Kitchen.. 1 By Mouth  Once Daily 4)  Metformin Hcl 1000 Mg Tabs (Metformin Hcl) .... Take 1 Tablet By Mouth Two Times A Day 5)  Digoxin 0.25 Mg Tabs (Digoxin) .Marland Kitchen.. 1 Tab By Mouth Once Daily 6)  Klor-Con M20 20 Meq Cr-Tabs (Potassium Chloride Crys Cr) .Marland Kitchen.. 1 Tab By Mouth Once Daily 7)  Aspirin Ec 325 Mg Tbec (Aspirin) .... Take One Tablet By Mouth Daily 8)  Spironolactone 25 Mg Tabs (Spironolactone) .... Take 1/2 Tablet By Mouth Two Times A Day 9)  Plavix 75 Mg Tabs (Clopidogrel Bisulfate) .... Take One Tablet By Mouth Daily 10)  Tricor 145 Mg Tabs (Fenofibrate) .Marland Kitchen.. 1 Tab By Mouth At Bedtime 11)  Fish Oil   Oil (Fish Oil) .... 2000mg  Daily 12)  Allegra 180 Mg Tabs (Fexofenadine Hcl) .... As Needed 13)  Prevacid 15 Mg Cpdr (Lansoprazole) .... Once Daily As Needed 14)  Crestor 20 Mg Tabs (Rosuvastatin Calcium) .... Take One Tablet By Mouth Daily.  Current Medications (verified): 1)  Carvedilol 25 Mg Tabs (Carvedilol) .... Take Two Tablets By Mouth Twice A Day 2)  Furosemide 40 Mg Tabs (Furosemide) .Marland Kitchen.. 1 Tab By Mouth Two Times A Day 3)  Altace 10 Mg Caps (Ramipril) .Marland Kitchen.. 1 By Mouth Once Daily 4)  Digoxin 0.25 Mg Tabs (Digoxin) .Marland Kitchen.. 1 Tab By Mouth Once Daily 5)  Klor-Con M20 20 Meq Cr-Tabs (Potassium Chloride Crys Cr) .Marland Kitchen.. 1 Tab By Mouth Once Daily 6)  Aspirin Ec 325 Mg Tbec (Aspirin) .... Take One Tablet  By Mouth Daily 7)  Spironolactone 25 Mg Tabs (Spironolactone) .... Take 1/2 Tablet By Mouth Two Times A Day 8)  Plavix 75 Mg Tabs (Clopidogrel Bisulfate) .... Take One Tablet By Mouth Daily 9)  Tricor 145 Mg Tabs (Fenofibrate) .Marland Kitchen.. 1 Tab By Mouth At Bedtime 10)  Fish Oil   Oil (Fish Oil) .... 2000mg  Daily 11)  Allegra 180 Mg Tabs (Fexofenadine Hcl) .... As Needed 12)  Prevacid 15 Mg Cpdr (Lansoprazole) .... Once Daily As Needed 13)  Crestor 20 Mg Tabs (Rosuvastatin Calcium) .... Take One Tablet By Mouth Daily. 14)  Cymbalta 30 Mg Cpep (Duloxetine Hcl) .... One By Mouth Once Daily For Depresion 15)  Contour Blood Glucose System W/device Kit (Blood Glucose Monitoring Suppl) .... Use Two Times A Day As Directed 16)  Levemir Flexpen 100 Unit/ml Soln (Insulin Detemir) .... 50 Units Once Daily  Allergies (verified): 1)  ! Metformin Hcl  Past History:  Past Medical History: Last updated: 04/11/2009 1. Congestive heart failure secondary to ischemic cardiomyopathy          a. ECHO 12/09: EF 45-50% (previously 20%) 2. 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 s/p stent LAD 3. Morbid obesity 4. Diabetes.  5. Hyperlipidemia 6. GERD  Family History: Last updated: 10/07/2008 Both of his parents are alive.  His mother is currently   age 73.  She is status post CABG at age 52.  She also has a history of   hyperlipidemia.  Father has a history of hypertension, diabetes as well   as VT and is status post ICD placement.  Denies any prior history of   coronary disease.  He has one brother and sister, both are alive and   well.   Social History: Last updated: 10/07/2008 Single  Tobacco Use - Yes.  Alcohol Use - no Regular Exercise - no Drug Use - yes  Risk Factors: Alcohol Use: 0 (06/27/2010)  Risk Factors: Smoking Status:  quit < 6 months (06/27/2010)  Past Surgical History: PTCA/stent  Family History: Reviewed history from 10/07/2008 and no changes required. Both of his  parents are alive.  His mother is currently   age 69.  She is status post CABG at age 4.  She also has a history of   hyperlipidemia.  Father has a history of hypertension, diabetes as well   as VT and is status post ICD placement.  Denies any prior history of   coronary disease.  He has one brother and sister, both are alive and   well.   Social History: Reviewed history from 10/07/2008 and no changes required. Single  Tobacco Use - Yes.  Alcohol Use - no Regular Exercise - no Drug Use - yes Smoking Status:  quit < 6 months Hepatitis Risk:  no risk noted HIV Risk:  no risk noted STD Risk:  no risk noted Dental Care w/in 6 mos.:  yes Sun Exposure-Excessive:  no Sexual History:  currently monogamous Drug Use:  never Blood Transfusions:  no  Review of Systems       The patient complains of abdominal pain, severe indigestion/heartburn, suspicious skin lesions, and depression.  The patient denies anorexia, fever, weight loss, weight gain, chest pain, syncope, dyspnea on exertion, peripheral edema, prolonged cough, headaches, hemoptysis, melena, hematochezia, hematuria, muscle weakness, enlarged lymph nodes, and testicular masses.   GI:  Complains of abdominal pain and diarrhea; denies bloody stools, change in bowel habits, constipation, dark tarry stools, gas, indigestion, loss of appetite, nausea, vomiting, vomiting blood, and yellowish skin color. Psych:  Complains of depression, easily tearful, and irritability; denies alternate hallucination ( auditory/visual), anxiety, easily angered, mental problems, panic attacks, sense of great danger, suicidal thoughts/plans, thoughts of violence, unusual visions or sounds, and thoughts /plans of harming others.  Physical Exam  General:  alert, well-developed, well-nourished, well-hydrated, appropriate dress, normal appearance, healthy-appearing, cooperative to examination, good hygiene, and overweight-appearing.   Head:  normocephalic,  atraumatic, no abnormalities observed, and no abnormalities palpated.   Eyes:  No corneal or conjunctival inflammation noted. EOMI. Perrla. Funduscopic exam benign, without hemorrhages, exudates or papilledema. Vision grossly normal. no icterus. Mouth:  Oral mucosa and oropharynx without lesions or exudates.  Teeth in good repair. Neck:  supple, full ROM, no masses, no thyromegaly, no thyroid nodules or tenderness, no JVD, normal carotid upstroke, no carotid bruits, no cervical lymphadenopathy, and no neck tenderness.   Lungs:  normal respiratory effort, no intercostal retractions, no accessory muscle use, normal breath sounds, no dullness, no fremitus, no crackles, and no wheezes.   Heart:  normal rate, regular rhythm, no murmur, no gallop, no rub, and no JVD.   Abdomen:  soft, non-tender, normal bowel sounds, no distention, no masses, no guarding, no rigidity, no rebound tenderness, no abdominal hernia, no inguinal hernia, no hepatomegaly, and no splenomegaly.   Rectal:  No external abnormalities noted. Normal sphincter tone. No rectal masses or tenderness. Genitalia:  uncircumcised, no hydrocele, no varicocele, no scrotal masses, no testicular masses or atrophy, no cutaneous lesions, no urethral discharge, and penis warts.   Prostate:  Prostate gland firm and smooth, no enlargement, nodularity, tenderness, mass, asymmetry or induration. Msk:  No deformity or scoliosis noted of thoracic or lumbar spine.   Pulses:  R and L carotid,radial,femoral,dorsalis pedis and posterior tibial pulses are full and equal bilaterally Extremities:  trace left pedal edema and trace right pedal edema.   Neurologic:  No cranial nerve deficits noted. Station and gait are  normal. Plantar reflexes are down-going bilaterally. DTRs are symmetrical throughout. Sensory, motor and coordinative functions appear intact. Skin:  tattoo(s) and wart(s) on his foreskin.  color normal, no rashes, no ecchymoses, no petechiae, no purpura,  no ulcerations, no edema, tattoo(s), and wart(s).   Cervical Nodes:  no anterior cervical adenopathy and no posterior cervical adenopathy.   Axillary Nodes:  no R axillary adenopathy and no L axillary adenopathy.   Inguinal Nodes:  no R inguinal adenopathy and no L inguinal adenopathy.   Psych:  Oriented X3, memory intact for recent and remote, good eye contact, not anxious appearing, not agitated, not suicidal, not homicidal, dysphoric affect, and subdued.    Diabetes Management Exam:    Foot Exam (with socks and/or shoes not present):       Sensory-Pinprick/Light touch:          Left medial foot (L-4): normal          Left dorsal foot (L-5): normal          Left lateral foot (S-1): normal          Right medial foot (L-4): normal          Right dorsal foot (L-5): normal          Right lateral foot (S-1): normal       Sensory-Monofilament:          Left foot: normal          Right foot: normal       Inspection:          Left foot: normal          Right foot: normal       Nails:          Left foot: normal          Right foot: normal   Impression & Recommendations:  Problem # 1:  VENEREAL WART (ICD-078.11) Assessment New  Orders: Urology Referral (Urology)  Problem # 2:  ABDOMINAL PAIN OTHER SPECIFIED SITE (ICD-789.09) Assessment: Unchanged  His updated medication list for this problem includes:    Aspirin Ec 325 Mg Tbec (Aspirin) .Marland Kitchen... Take one tablet by mouth daily  Orders: Venipuncture (40981) TLB-BMP (Basic Metabolic Panel-BMET) (80048-METABOL) TLB-CBC Platelet - w/Differential (85025-CBCD) TLB-Hepatic/Liver Function Pnl (80076-HEPATIC) TLB-TSH (Thyroid Stimulating Hormone) (84443-TSH) TLB-Amylase (82150-AMYL) TLB-Lipase (83690-LIPASE) TLB-A1C / Hgb A1C (Glycohemoglobin) (83036-A1C) TLB-Udip w/ Micro (81001-URINE)  Discussed use of medications, application of heat or cold, and exercises.   Problem # 3:  DIABETES MELLITUS, TYPE II (ICD-250.00) Assessment:  Deteriorated  The following medications were removed from the medication list:    Metformin Hcl 1000 Mg Tabs (Metformin hcl) .Marland Kitchen... Take 1 tablet by mouth two times a day His updated medication list for this problem includes:    Altace 10 Mg Caps (Ramipril) .Marland Kitchen... 1 by mouth once daily    Aspirin Ec 325 Mg Tbec (Aspirin) .Marland Kitchen... Take one tablet by mouth daily    Levemir Flexpen 100 Unit/ml Soln (Insulin detemir) .Marland KitchenMarland KitchenMarland KitchenMarland Kitchen 50 units once daily  Orders: Venipuncture (19147) TLB-BMP (Basic Metabolic Panel-BMET) (80048-METABOL) TLB-CBC Platelet - w/Differential (85025-CBCD) TLB-Hepatic/Liver Function Pnl (80076-HEPATIC) TLB-TSH (Thyroid Stimulating Hormone) (84443-TSH) TLB-Amylase (82150-AMYL) TLB-Lipase (83690-LIPASE) TLB-A1C / Hgb A1C (Glycohemoglobin) (83036-A1C) TLB-Udip w/ Micro (81001-URINE) Ophthalmology Referral (Ophthalmology) Diabetic Clinic Referral (Diabetic)  Labs Reviewed: Creat: 0.6 (06/18/2010)    Reviewed HgBA1c results: 9.9 (08/03/2008)  Problem # 4:  HYPERTENSION, BENIGN (ICD-401.1) Assessment: Unchanged  His updated medication list for this problem includes:  Carvedilol 25 Mg Tabs (Carvedilol) .Marland Kitchen... Take two tablets by mouth twice a day    Furosemide 40 Mg Tabs (Furosemide) .Marland Kitchen... 1 tab by mouth two times a day    Altace 10 Mg Caps (Ramipril) .Marland Kitchen... 1 by mouth once daily    Spironolactone 25 Mg Tabs (Spironolactone) .Marland Kitchen... Take 1/2 tablet by mouth two times a day  Orders: Venipuncture (98119) TLB-BMP (Basic Metabolic Panel-BMET) (80048-METABOL) TLB-CBC Platelet - w/Differential (85025-CBCD) TLB-Hepatic/Liver Function Pnl (80076-HEPATIC) TLB-TSH (Thyroid Stimulating Hormone) (84443-TSH) TLB-Amylase (82150-AMYL) TLB-Lipase (83690-LIPASE) TLB-A1C / Hgb A1C (Glycohemoglobin) (83036-A1C) TLB-Udip w/ Micro (81001-URINE)  BP today: 140/80 Prior BP: 141/92 (06/18/2010)  10 Yr Risk Heart Disease: N/A  Labs Reviewed: K+: 3.9 (06/18/2010) Creat: : 0.6 (06/18/2010)    Chol: 235 (06/18/2010)   HDL: 30.40 (06/18/2010)   LDL: DEL (08/03/2008)   TG: 490.0 Triglyceride is over 400; calculations on Lipids are invalid. mg/dL (14/78/2956)  Problem # 5:  DEPRESSIVE DISORDER NOT ELSEWHERE CLASSIFIED (ICD-311) Assessment: Deteriorated  His updated medication list for this problem includes:    Cymbalta 30 Mg Cpep (Duloxetine hcl) ..... One by mouth once daily for depresion  Discussed treatment options, including trial of antidpressant medication. Will refer to behavioral health. Follow-up call in in 24-48 hours and recheck in 2 weeks, sooner as needed. Patient agrees to call if any worsening of symptoms or thoughts of doing harm arise. Verified that the patient has no suicidal ideation at this time.   Complete Medication List: 1)  Carvedilol 25 Mg Tabs (Carvedilol) .... Take two tablets by mouth twice a day 2)  Furosemide 40 Mg Tabs (Furosemide) .Marland Kitchen.. 1 tab by mouth two times a day 3)  Altace 10 Mg Caps (Ramipril) .Marland Kitchen.. 1 by mouth once daily 4)  Digoxin 0.25 Mg Tabs (Digoxin) .Marland Kitchen.. 1 tab by mouth once daily 5)  Klor-con M20 20 Meq Cr-tabs (Potassium chloride crys cr) .Marland Kitchen.. 1 tab by mouth once daily 6)  Aspirin Ec 325 Mg Tbec (Aspirin) .... Take one tablet by mouth daily 7)  Spironolactone 25 Mg Tabs (Spironolactone) .... Take 1/2 tablet by mouth two times a day 8)  Plavix 75 Mg Tabs (Clopidogrel bisulfate) .... Take one tablet by mouth daily 9)  Tricor 145 Mg Tabs (Fenofibrate) .Marland Kitchen.. 1 tab by mouth at bedtime 10)  Fish Oil Oil (Fish oil) .... 2000mg  daily 11)  Allegra 180 Mg Tabs (Fexofenadine hcl) .... As needed 12)  Prevacid 15 Mg Cpdr (Lansoprazole) .... Once daily as needed 13)  Crestor 20 Mg Tabs (Rosuvastatin calcium) .... Take one tablet by mouth daily. 14)  Cymbalta 30 Mg Cpep (Duloxetine hcl) .... One by mouth once daily for depresion 15)  Contour Blood Glucose System W/device Kit (Blood glucose monitoring suppl) .... Use two times a day as directed 16)  Levemir  Flexpen 100 Unit/ml Soln (Insulin detemir) .... 50 units once daily  Other Orders: Tdap => 58yrs IM (21308) Admin 1st Vaccine (65784) Admin 1st Vaccine (69629) Flu Vaccine 70yrs + (52841)  Hypertension Assessment/Plan:      The patient's hypertensive risk group is category C: Target organ damage and/or diabetes.  Today's blood pressure is 140/80.  His blood pressure goal is < 130/80.  Lipid Assessment/Plan:      Based on NCEP/ATP III, the patient's risk factor category is "history of coronary disease, peripheral vascular disease, cerebrovascular disease, or aortic aneurysm along with either diabetes, current smoker, or LDL > 130 plus HDL < 40 plus triglycerides > 200".  The patient's lipid goals  are as follows: Total cholesterol goal is 200; LDL cholesterol goal is 70; HDL cholesterol goal is 40; Triglyceride goal is 150.    Colorectal Screening:  Current Recommendations:    Hemoccult: NEG X 1 today  Immunization & Chemoprophylaxis:    Tetanus vaccine: Tdap  (06/27/2010)    Influenza vaccine: Fluvax 3+  (06/27/2010)  Patient Instructions: 1)  Please schedule a follow-up appointment in 1 month. 2)  It is important that you exercise regularly at least 20 minutes 5 times a week. If you develop chest pain, have severe difficulty breathing, or feel very tired , stop exercising immediately and seek medical attention. 3)  You need to lose weight. Consider a lower calorie diet and regular exercise.  4)  If you could be exposed to sexually transmitted diseases, you should use a condom. 5)  Check your blood sugars regularly. If your readings are usually above 200 or below 70 you should contact our office. 6)  It is important that your Diabetic A1c level is checked every 3 months. 7)  See your eye doctor yearly to check for diabetic eye damage. 8)  Check your feet each night for sore areas, calluses or signs of infection. 9)  Check your Blood Pressure regularly. If it is above 130/80: you should  make an appointment. Prescriptions: LEVEMIR FLEXPEN 100 UNIT/ML SOLN (INSULIN DETEMIR) 50 units once daily  #3 pens x 0   Entered and Authorized by:   Etta Grandchild MD   Signed by:   Etta Grandchild MD on 06/27/2010   Method used:   Samples Given   RxID:   [id number] CONTOUR BLOOD GLUCOSE SYSTEM W/DEVICE KIT (BLOOD GLUCOSE MONITORING SUPPL) use two times a day as directed  #1 x 0   Entered and Authorized by:   Etta Grandchild MD   Signed by:   Etta Grandchild MD on 06/27/2010   Method used:   Samples Given   RxID:   [id number] CYMBALTA 30 MG CPEP (DULOXETINE HCL) One by mouth once daily for depresion  #42 x 0   Entered and Authorized by:   Etta Grandchild MD   Signed by:   Etta Grandchild MD on 06/27/2010   Method used:   Samples Given   RxID:   [id number]    Orders Added: 1)  Venipuncture [36415] 2)  TLB-BMP (Basic Metabolic Panel-BMET) [80048-METABOL] 3)  TLB-CBC Platelet - w/Differential [85025-CBCD] 4)  TLB-Hepatic/Liver Function Pnl [80076-HEPATIC] 5)  TLB-TSH (Thyroid Stimulating Hormone) [84443-TSH] 6)  TLB-Amylase [82150-AMYL] 7)  TLB-Lipase [83690-LIPASE] 8)  TLB-A1C / Hgb A1C (Glycohemoglobin) [83036-A1C] 9)  TLB-Udip w/ Micro [81001-URINE] 10)  Ophthalmology Referral [Ophthalmology] 26)  Urology Referral [Urology] 12)  Diabetic Clinic Referral [Diabetic] 13)  Tdap => 51yrs IM [90715] 14)  Admin 1st Vaccine [90471] 15)  Admin 1st Vaccine [90471] 16)  Flu Vaccine 64yrs + [75643] 17)  New Patient Level IV [32951]   Immunizations Administered:  Tetanus Vaccine:    Vaccine Type: Tdap    Site: right deltoid    Mfr: GlaxoSmithKline    Dose: 0.5 ml    Route: IM    Given by: Alysia Penna    Exp. Date: 06/07/2012    Lot #: [id number]   Immunizations Administered:  Tetanus Vaccine:    Vaccine Type: Tdap    Site: right deltoid    Mfr: GlaxoSmithKline    Dose: 0.5 ml    Route: IM    Given by: Alysia Penna  Exp. Date: 06/07/2012     Lot #: [id number]  Laboratory Results   Blood Tests     Glucose (random): 315 mg/dL   (Normal Range: 56-213)    Flu Vaccine Consent Questions     Do you have a history of severe allergic reactions to this vaccine? no    Any prior history of allergic reactions to egg and/or gelatin? no    Do you have a sensitivity to the preservative Thimersol? no    Do you have a past history of Guillan-Barre Syndrome? no    Do you currently have an acute febrile illness? no    Have you ever had a severe reaction to latex? no    Vaccine information given and explained to patient? yes    Are you currently pregnant? no    Lot Number:[id number]   Exp Date:02/16/2011   Site Given  Left Deltoid IM   .[email or web address]

## 2010-09-18 NOTE — Assessment & Plan Note (Signed)
Summary: f50m   Visit Type:  Follow-up Primary Provider:  [name]  MD  CC:  headache.  History of Present Illness: [name] Foley is a 35 year old male with a history of morbid obesity, hypertension, diabetes, and congestive heart failure secondary to severe ischemic cardiomyopathy with ejection fraction of 20% previously.  Cardiac catheterization 2009 year showed severe three- vessel coronary artery disease which was not amenable to bypass surgery due to severe distal disease.  He did undergo angioplasty of a portion of his LAD.  Most recent echo in 12/09 showed EF 45-50%. He returns for followup.   From a cardiac standpoint doing very well. Denies any CP or dyspnea. Compliant with meds. No orthopnea, PND or edema. Has lost 15 pounds. Tries to walk every day. Main complaint is chronic HA.   Current Medications (verified): 1)  Carvedilol 25 Mg Tabs (Carvedilol) .... Take Two Tablets By Mouth Twice A Day 2)  Furosemide 40 Mg Tabs (Furosemide) .Marland Kitchen.. 1 Tab By Mouth Two Times A Day 3)  Altace 10 Mg Caps (Ramipril) .Marland Kitchen.. 1 By Mouth Once Daily 4)  Metformin Hcl 1000 Mg Tabs (Metformin Hcl) .... Take 1 Tablet By Mouth Two Times A Day 5)  Digoxin 0.25 Mg Tabs (Digoxin) .Marland Kitchen.. 1 Tab By Mouth Once Daily 6)  Klor-Con M20 20 Meq Cr-Tabs (Potassium Chloride Crys Cr) .Marland Kitchen.. 1 Tab By Mouth Once Daily 7)  Zocor 80 Mg Tabs (Simvastatin) .Marland Kitchen.. 1 Tab By Mouth At Bedtime 8)  Aspirin Ec 325 Mg Tbec (Aspirin) .... Take One Tablet By Mouth Daily 9)  Spironolactone 25 Mg Tabs (Spironolactone) .... Take 1/2 Tablet By Mouth Two Times A Day 10)  Plavix 75 Mg Tabs (Clopidogrel Bisulfate) .... Take One Tablet By Mouth Daily 11)  Tricor 145 Mg Tabs (Fenofibrate) .Marland Kitchen.. 1 Tab By Mouth At Bedtime 12)  Fish Oil   Oil (Fish Oil) .... 2000mg  Daily 13)  Allegra 180 Mg Tabs (Fexofenadine Hcl) .... As Needed 14)  Zoloft 25 Mg Tabs (Sertraline Hcl) .... 2 By Mouth Daily 15)  Prevacid 15 Mg Cpdr (Lansoprazole) .... Once Daily As  Needed  Allergies (verified): No Known Drug Allergies  Past History:  Past Medical History: Last updated: 04/11/2009 1. Congestive heart failure secondary to ischemic cardiomyopathy          a. ECHO 12/09: EF 45-50% (previously 20%) 2. 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 s/p stent LAD 3. Morbid obesity 4. Diabetes.  5. Hyperlipidemia 6. GERD  Review of Systems       As per HPI and past medical history; otherwise all systems negative.   Vital Signs:  Patient profile:   34 year old male Height:      74 inches Weight:      303 pounds BMI:     39.04 Pulse rate:   78 / minute BP sitting:   122 / 80  (left arm) Cuff size:   regular  Vitals Entered By: Hardin Negus, RMA (November 10, 2009 4:16 PM)  Physical Exam  General:  Gen: well appearing. no resp difficulty HEENT: normal Neck: supple. no JVD. Carotids 2+ bilat; no bruits. No lymphadenopathy or thryomegaly appreciated. Cor: PMI nondisplaced. Regular rate & rhythm. No rubs, gallops, murmur. Lungs: clear Abdomen: soft, nontender, nondistended. No hepatosplenomegaly. No bruits or masses. Good bowel sounds. Extremities: no cyanosis, clubbing, rash, edema Neuro: alert & orientedx3, cranial nerves grossly intact. moves all 4 extremities w/o difficulty. affect frustrated  Impression & Recommendations:  Problem # 1:  SYSTOLIC HEART FAILURE, CHRONIC (ICD-428.22) Doing well. NYHA II. Volume status looks good. EF improved. Continue current regimen. Due for f/u echo to make sure EF stable. i congratulated him on wt loss.  Problem # 2:  CAD, UNSPECIFIED SITE (ICD-414.00) Stable. No evidence of ischemia. Continue current regimen.  Problem # 3:  HYPERLIPIDEMIA-MIXED (ICD-272.4) Goal LDL < 70. Check lipids and liver panel.  Other Orders: EKG w/ Interpretation (93000) Echocardiogram (Echo)  Patient Instructions: 1)  Your physician has requested that you have an echocardiogram.  Echocardiography is a  painless test that uses sound waves to create images of your heart. It provides your doctor with information about the size and shape of your heart and how well your heart's chambers and valves are working.  This procedure takes approximately one hour. There are no restrictions for this procedure. 2)  Your physician recommends that you return for a FASTING lipid, liver, and bmet profile with echo--428.22, 414.01, 272.0 3)  Follow up in 6 months

## 2010-09-18 NOTE — Progress Notes (Signed)
Summary: test results  Phone Note Outgoing Call   Call placed by: Meredith Staggers, RN,  December 11, 2009 5:12 PM Call placed to: Patient Summary of Call: called pt w/lab and echo results, and also pts Plavix is in from company,  left mess w/pts dad that Plavix was at front desk and for pt to call back for test results   Follow-up for Phone Call        returning call, [phone], Migdalia Dk  December 12, 2009 10:33 AM   Additional Follow-up for Phone Call Additional follow up Details #1::        Called patient back...he is aware to pick up Plavix. Gave him lab results and echo results. Additional Follow-up by: J REISS RN    Prescriptions: TRICOR 145 MG TABS (FENOFIBRATE) 1 tab by mouth at bedtime  #30 x 6   Entered by:   Layne Benton, RN, BSN   Authorized by:   Dolores Patty, MD, [facility]   Signed by:   Layne Benton, RN, BSN on 12/12/2009   Method used:   Electronically to        [facility] [phone]* (retail)       [address]       [place], [place]  [address]       Ph: [phone]       Fax: [phone]   RxID:   [id number]

## 2010-09-18 NOTE — Assessment & Plan Note (Signed)
Summary: 6 month fu appt/mt   Visit Type:  Follow-up Primary Provider:  [name]  MD  CC:  stomach pain -- no cardiac.  History of Present Illness: [name] Foley is a 35 year old male with a history of morbid obesity, hypertension, diabetes, and congestive heart failure secondary to severe ischemic cardiomyopathy with ejection fraction of 20% previously.  Cardiac catheterization 2009 year showed severe three- vessel coronary artery disease which was not amenable to bypass surgery due to severe distal disease.  He did undergo angioplasty of a portion of his LAD.  Echo in 12/09 showed EF 45-50%. He returns for followup. Since his last visit, he had a f/u echo that demonstrated stable EF at 40% with multiple wall motion abnormalities and severe diastolic dysfunction.  His triglycerides were over 800 and his direct LDL was 89.2.  He had been out of his meds for a while and was directed to restart them.  Since last seen, he continues to have abdominal pain.  He has had this symptom for a couple years now.  It is off and on.  No particular foods bring it on.  He notes it without eating or with eating and drinking.  Denies any vomiting.  He does note diarrhea.  No melena or hematochezia.  He had these symptoms in 2009 when he went to the hospital and thought he had a stomach problem.  But, this is when he was diagnosed with CAD.  However, his symptoms have basically continued off and on since then.  They are nonexertional.  He denies dyspnea.  No orthopnea, PND or edema.  No syncope.    Current Medications (verified): 1)  Carvedilol 25 Mg Tabs (Carvedilol) .... Take Two Tablets By Mouth Twice A Day 2)  Furosemide 40 Mg Tabs (Furosemide) .Marland Kitchen.. 1 Tab By Mouth Two Times A Day 3)  Altace 10 Mg Caps (Ramipril) .Marland Kitchen.. 1 By Mouth Once Daily 4)  Metformin Hcl 1000 Mg Tabs (Metformin Hcl) .... Take 1 Tablet By Mouth Two Times A Day 5)  Digoxin 0.25 Mg Tabs (Digoxin) .Marland Kitchen.. 1 Tab By Mouth Once Daily 6)  Klor-Con M20  20 Meq Cr-Tabs (Potassium Chloride Crys Cr) .Marland Kitchen.. 1 Tab By Mouth Once Daily 7)  Simvastatin 40 Mg Tabs (Simvastatin) .... Take One Tablet By Mouth Daily At Bedtime 8)  Aspirin Ec 325 Mg Tbec (Aspirin) .... Take One Tablet By Mouth Daily 9)  Spironolactone 25 Mg Tabs (Spironolactone) .... Take 1/2 Tablet By Mouth Two Times A Day 10)  Plavix 75 Mg Tabs (Clopidogrel Bisulfate) .... Take One Tablet By Mouth Daily 11)  Tricor 145 Mg Tabs (Fenofibrate) .Marland Kitchen.. 1 Tab By Mouth At Bedtime 12)  Fish Oil   Oil (Fish Oil) .... 2000mg  Daily 13)  Allegra 180 Mg Tabs (Fexofenadine Hcl) .... As Needed 14)  Prevacid 15 Mg Cpdr (Lansoprazole) .... Once Daily As Needed  Allergies (verified): No Known Drug Allergies  Past History:  Past Medical History: Reviewed history from 04/11/2009 and no changes required. 1. Congestive heart failure secondary to ischemic cardiomyopathy          a. ECHO 12/09: EF 45-50% (previously 20%) 2. 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 s/p stent LAD 3. Morbid obesity 4. Diabetes.  5. Hyperlipidemia 6. GERD  Review of Systems General:  Denies fever and chills. GI:  See HPI. Psych:  Complains of depression.  Vital Signs:  Patient profile:   35 year old male Height:  74 inches Weight:      298 pounds BMI:     38.40 Pulse rate:   100 / minute BP sitting:   141 / 92  (right arm) Cuff size:   large  Vitals Entered By: Hardin Negus, RMA (June 18, 2010 1:38 PM)  Physical Exam  General:  Well nourished, well developed, in no acute distress HEENT: normal Neck: no JVD Cardiac:  normal S1, S2; RRR; no murmur Lungs:  clear to auscultation bilaterally, no wheezing, rhonchi or rales Abd: soft, no hepatomegaly; diffuse tenderness in all quadrants Ext: no clubbing, cyanosis or edema Vascular: no carotid  bruits Skin: warm and dry    EKG  Procedure date:  06/18/2010  Findings:      Normal sinus rhythm with rate of:  100 LAD PACs and PVCs    PRWP Q waves V5-6 no ischemic changes   Impression & Recommendations:  Problem # 1:  SYSTOLIC HEART FAILURE, CHRONIC (ICD-428.22) He is NYHA Class 1-2.  He is on a stable regimen and his recent echo demonstrated a stable EF. Continue current meds.    Orders: TLB-Lipid Panel (80061-LIPID)  Problem # 2:  CAD, UNSPECIFIED SITE (ICD-414.00) No anginal symptoms. His abdominal pain seems to predate his CAD diagnosis. It sounds like he was basically asymptomatic when he presented.  I don't think his abdominal pain is an anginal equivalent. Continue ASA and Plavix  Problem # 3:  HYPERLIPIDEMIA-MIXED (ICD-272.4) Repeat FLP today and LFTs   Orders: TLB-Lipid Panel (80061-LIPID)  Problem # 4:  HYPERTENSION, BENIGN (ICD-401.1) BP a little high today. He notes he is having abd pain today and this usually occurs when he has pain. His BPs have been stable in the past. No change in meds.  Orders: TLB-BMP (Basic Metabolic Panel-BMET) (80048-METABOL)  Problem # 5:  DIABETES MELLITUS, TYPE II (ICD-250.00) He needs to see primary care.  Orders: Primary Care Referral (Primary)  Problem # 6:  ABDOMINAL PAIN OTHER SPECIFIED SITE (ICD-789.09) Etiology unclear.  Symptoms have been going on for a couple years now without much change. He may have diabetic gastroparesis or just simply IBS. He notes worsening symptoms of depression lately as well. We will facilitate getting him in to primary care for evaluation and follow up of his other medical problems.  Problem # 7:  DEPRESSIVE DISORDER NOT ELSEWHERE CLASSIFIED (ICD-311) As above, get him into primary care.   Orders: Primary Care Referral (Primary)  Other Orders: TLB-Hepatic/Liver Function Pnl (80076-HEPATIC)  Patient Instructions: 1)  Your physician recommends that you schedule a follow-up appointment in: 4 months with Dr. [name] 2)  Your physician recommends that you return for lab work CH:ENIDP BMET, LFT, Lipid panel. 3)   You have been referred to Primary Care at [facility] office with  Dr. Yetta Barre for Diabetes, Depression and stomache pain.

## 2010-09-18 NOTE — Letter (Signed)
Summary: Results Follow-up Letter  [facility]  [address] [place], [place] [address]   Phone: [phone]  Fax: [phone]    06/28/2010  [address] [place], [place]  [address]  Dear Mr. Brecheen,   The following are the results of your recent test(s):  Test     Result     Blood sugars   very high CBC       normal Liver/kidney   normal Thyroid     normal Urine       a lot of sugar   _________________________________________________________  Please call for an appointment soon _________________________________________________________ _________________________________________________________ _________________________________________________________  Sincerely,  Sanda Linger MD  [facility]

## 2010-09-18 NOTE — Letter (Signed)
Summary: [facility]   Imported By: Sherian Rein 07/23/2010 07:35:09  _____________________________________________________________________  External Attachment:    Type:   Image     Comment:   External Document

## 2010-09-18 NOTE — Progress Notes (Signed)
     Diabetes Management Exam:    Eye Exam:       Eye Exam done elsewhere          Date: 07/04/2010          Results: diabetic retinopathy          Done by: Randon Goldsmith

## 2010-09-18 NOTE — Miscellaneous (Signed)
Clinical Lists Changes  Observations: Added new observation of ECHOINTERP:  - Left ventricle: The cavity size was mildly dilated. Systolic       function was mildly to moderately reduced. The estimated ejection       fraction was 40%. Akinesis of the posterior wall, the apex, and       the apical infererior wall. Hypokinesis of the basal to mid       inferior wall and the anterolateral wall. Doppler parameters are       consistent with restrictive physiology, indicative of decreased       left ventricular diastolic compliance and/or increased left atrial       pressure. E/medial E' > 15, suggestive of LV end diastolic       pressure > 20 mmHg.     - Aortic valve: There was no stenosis.     - Mitral valve: Trivial regurgitation.     - Left atrium: The atrium was mildly dilated.     - Pulmonary veins: Systolic blunting in the pulmonary vein doppler       pattern.     - Right ventricle: The cavity size was normal. Systolic function was       normal.     - Pulmonary arteries: No TR jet so unable to estimate PA systolic       pressure.     - Systemic veins: The IVC was not visualized.     Impressions:            - Mildly dilated LV with mild to moderate systolic dysfunction, EF       40%. Wall motion abnormalities as mentioned above. Severe       diastolic dysfunction with evidence for elevated LV filling       pressure. Normal RV size and systolic function. (12/05/2009 15:42)      Echocardiogram  Procedure date:  12/05/2009  Findings:       - Left ventricle: The cavity size was mildly dilated. Systolic       function was mildly to moderately reduced. The estimated ejection       fraction was 40%. Akinesis of the posterior wall, the apex, and       the apical infererior wall. Hypokinesis of the basal to mid       inferior wall and the anterolateral wall. Doppler parameters are       consistent with restrictive physiology, indicative of decreased       left ventricular  diastolic compliance and/or increased left atrial       pressure. E/medial E' > 15, suggestive of LV end diastolic       pressure > 20 mmHg.     - Aortic valve: There was no stenosis.     - Mitral valve: Trivial regurgitation.     - Left atrium: The atrium was mildly dilated.     - Pulmonary veins: Systolic blunting in the pulmonary vein doppler       pattern.     - Right ventricle: The cavity size was normal. Systolic function was       normal.     - Pulmonary arteries: No TR jet so unable to estimate PA systolic       pressure.     - Systemic veins: The IVC was not visualized.     Impressions:            - Mildly dilated LV with mild to moderate systolic dysfunction, EF  40%. Wall motion abnormalities as mentioned above. Severe       diastolic dysfunction with evidence for elevated LV filling       pressure. Normal RV size and systolic function.

## 2010-09-19 ENCOUNTER — Other Ambulatory Visit: Payer: Medicare Other

## 2010-09-19 ENCOUNTER — Ambulatory Visit: Admit: 2010-09-19 | Payer: Self-pay | Admitting: Physician Assistant

## 2010-09-20 NOTE — Letter (Signed)
Summary: Lipid Letter  [facility]  [address] [place], [place] [address]   Phone: [phone]  Fax: [phone]    08/24/2010  [name] Foley [address] [place], [place]  [address]  Dear Casimiro Needle:  We have carefully reviewed your last lipid profile from 08/03/2008 and the results are noted below with a summary of recommendations for lipid management.    Cholesterol:       226     Goal: <200   HDL "good" Cholesterol:   57.84     Goal: >40   LDL "bad" Cholesterol:   166     Goal: <70   Triglycerides:       238.0     Goal: <150        TLC Diet (Therapeutic Lifestyle Change): Saturated Fats & Transfatty acids should be kept < 7% of total calories ***Reduce Saturated Fats Polyunstaurated Fat can be up to 10% of total calories Monounsaturated Fat Fat can be up to 20% of total calories Total Fat should be no greater than 25-35% of total calories Carbohydrates should be 50-60% of total calories Protein should be approximately 15% of total calories Fiber should be at least 20-30 grams a day ***Increased fiber may help lower LDL Total Cholesterol should be < 200mg /day Consider adding plant stanol/sterols to diet (example: Benacol spread) ***A higher intake of unsaturated fat may reduce Triglycerides and Increase HDL    Adjunctive Measures (may lower LIPIDS and reduce risk of Heart Attack) include: Aerobic Exercise (20-30 minutes 3-4 times a week) Limit Alcohol Consumption Weight Reduction Aspirin 75-81 mg a day by mouth (if not allergic or contraindicated) Dietary Fiber 20-30 grams a day by mouth     Current Medications: 1)    Carvedilol 25 Mg Tabs (Carvedilol) .... Take two tablets by mouth twice a day 2)    Furosemide 40 Mg Tabs (Furosemide) .Marland Kitchen.. 1 tab by mouth two times a day 3)    Altace 10 Mg Caps (Ramipril) .Marland Kitchen.. 1 by mouth once daily 4)    Digoxin 0.25 Mg Tabs (Digoxin) .Marland Kitchen.. 1 tab by mouth once daily 5)    Klor-con M20 20 Meq Cr-tabs (Potassium  chloride crys cr) .Marland Kitchen.. 1 tab by mouth once daily 6)    Aspirin Ec 325 Mg Tbec (Aspirin) .... Take one tablet by mouth daily 7)    Spironolactone 25 Mg Tabs (Spironolactone) .... Take 1/2 tablet by mouth two times a day 8)    Plavix 75 Mg Tabs (Clopidogrel bisulfate) .... Take one tablet by mouth daily 9)    Fish Oil   Oil (Fish oil) .... 2000mg  daily 10)    Allegra 180 Mg Tabs (Fexofenadine hcl) .... As needed 11)    Prevacid 15 Mg Cpdr (Lansoprazole) .... Once daily as needed 12)    Crestor 20 Mg Tabs (Rosuvastatin calcium) .... Take one tablet by mouth daily. 13)    Cymbalta 30 Mg Cpep (Duloxetine hcl) .... One by mouth once daily for depresion 14)    Contour Blood Glucose System W/device Kit (Blood glucose monitoring suppl) .... Use two times a day as directed 15)    Levemir Flexpen 100 Unit/ml Soln (Insulin detemir) .... 50 units once daily 16)    Glimepiride 4 Mg Tabs (Glimepiride) .... One by mouth once daily for diabetes  If you have any questions, please call. We appreciate being able to work with you.   Sincerely,    [facility] Etta Grandchild MD

## 2010-09-20 NOTE — Assessment & Plan Note (Signed)
Summary: 1 MTH FU--STC   Vital Signs:  Patient profile:   35 year old male Height:      74 inches Weight:      304 pounds BMI:     39.17 O2 Sat:      96 % on Room air Temp:     98.4 degrees F oral Pulse rate:   73 / minute Pulse rhythm:   regular Resp:     16 per minute BP sitting:   142 / 88  (left arm) Cuff size:   large  Vitals Entered By: Rock Nephew CMA (August 23, 2010 1:19 PM)  Nutrition Counseling: Patient's BMI is greater than 25 and therefore counseled on weight management options.  O2 Flow:  Room air CC: follow-up visit, Lipid Management Is Patient Diabetic? Yes Did you bring your meter with you today? No Pain Assessment Patient in pain? no       Does patient need assistance? Functional Status Self care Ambulation Normal   Primary Care Provider:  Etta Grandchild MD  CC:  follow-up visit and Lipid Management.  History of Present Illness:  Follow-Up Visit      This is a 35 year old man who presents for Follow-up visit.  The patient denies chest pain, palpitations, dizziness, syncope, low blood sugar symptoms, high blood sugar symptoms, edema, SOB, DOE, PND, and orthopnea.  Since the last visit the patient notes no new problems or concerns.  The patient reports taking meds as prescribed, monitoring BP, monitoring blood sugars, and dietary compliance.  When questioned about possible medication side effects, the patient notes none.    Lipid Management History:      Positive NCEP/ATP III risk factors include diabetes, HDL cholesterol less than 40, hypertension, and ASHD (either angina/prior MI/prior CABG).  Negative NCEP/ATP III risk factors include male age less than 39 years old, no family history for ischemic heart disease, non-tobacco-user status, no prior stroke/TIA, no peripheral vascular disease, and no history of aortic aneurysm.        The patient states that he knows about the "Therapeutic Lifestyle Change" diet.  His compliance with the TLC diet is  good.  The patient expresses understanding of adjunctive measures for cholesterol lowering.  Adjunctive measures started by the patient include fiber, ASA, limit alcohol consumpton, and weight reduction.  He expresses no side effects from his lipid-lowering medication.  The patient denies any symptoms to suggest myopathy or liver disease.     Preventive Screening-Counseling & Management  Alcohol-Tobacco     Alcohol drinks/day: 0     Alcohol Counseling: not indicated; patient does not drink     Smoking Status: quit < 6 months     Smoking Cessation Counseling: yes     Tobacco Counseling: to remain off tobacco products  Hep-HIV-STD-Contraception     Hepatitis Risk: no risk noted     HIV Risk: no risk noted     STD Risk: no risk noted     Dental Visit-last 6 months yes     Dental Care Counseling: to seek dental care; no dental care within six months     TSE monthly: yes     Testicular SE Education/Counseling to perform regular STE     Sun Exposure-Excessive: no      Sexual History:  currently monogamous.        Drug Use:  never.        Blood Transfusions:  no.    Clinical Review Panels:  Immunizations  Last Tetanus Booster:  Tdap (06/27/2010)   Last Flu Vaccine:  Fluvax 3+ (06/27/2010)  Lipid Management   Cholesterol:  235 (06/18/2010)   LDL (bad choesterol):  DEL (08/03/2008)   HDL (good cholesterol):  30.40 (06/18/2010)  Diabetes Management   HgBA1C:  11.4 (06/27/2010)   Creatinine:  0.6 (06/27/2010)   Last Dilated Eye Exam:  diabetic retinopathy (07/04/2010)   Last Foot Exam:  yes (08/23/2010)   Last Flu Vaccine:  Fluvax 3+ (06/27/2010)  CBC   WBC:  7.5 (06/27/2010)   RBC:  5.26 (06/27/2010)   Hgb:  17.9 (06/27/2010)   Hct:  49.9 (06/27/2010)   Platelets:  214.0 (06/27/2010)   MCV  94.9 (06/27/2010)   MCHC  35.9 (06/27/2010)   RDW  12.1 (06/27/2010)   PMN:  64.6 (06/27/2010)   Lymphs:  24.4 (06/27/2010)   Monos:  6.5 (06/27/2010)   Eosinophils:  3.2  (06/27/2010)   Basophil:  1.3 (06/27/2010)  Complete Metabolic Panel   Glucose:  391 (06/27/2010)   Sodium:  135 (06/27/2010)   Potassium:  4.4 (06/27/2010)   Chloride:  101 (06/27/2010)   CO2:  26 (06/27/2010)   BUN:  11 (06/27/2010)   Creatinine:  0.6 (06/27/2010)   Albumin:  3.9 (06/27/2010)   Total Protein:  6.8 (06/27/2010)   Calcium:  10.0 (06/27/2010)   Total Bili:  1.0 (06/27/2010)   Alk Phos:  88 (06/27/2010)   SGPT (ALT):  26 (06/27/2010)   SGOT (AST):  18 (06/27/2010)   Medications Prior to Update: 1)  Carvedilol 25 Mg Tabs (Carvedilol) .... Take Two Tablets By Mouth Twice A Day 2)  Furosemide 40 Mg Tabs (Furosemide) .Marland Kitchen.. 1 Tab By Mouth Two Times A Day 3)  Altace 10 Mg Caps (Ramipril) .Marland Kitchen.. 1 By Mouth Once Daily 4)  Digoxin 0.25 Mg Tabs (Digoxin) .Marland Kitchen.. 1 Tab By Mouth Once Daily 5)  Klor-Con M20 20 Meq Cr-Tabs (Potassium Chloride Crys Cr) .Marland Kitchen.. 1 Tab By Mouth Once Daily 6)  Aspirin Ec 325 Mg Tbec (Aspirin) .... Take One Tablet By Mouth Daily 7)  Spironolactone 25 Mg Tabs (Spironolactone) .... Take 1/2 Tablet By Mouth Two Times A Day 8)  Plavix 75 Mg Tabs (Clopidogrel Bisulfate) .... Take One Tablet By Mouth Daily 9)  Fish Oil   Oil (Fish Oil) .... 2000mg  Daily 10)  Allegra 180 Mg Tabs (Fexofenadine Hcl) .... As Needed 11)  Prevacid 15 Mg Cpdr (Lansoprazole) .... Once Daily As Needed 12)  Crestor 20 Mg Tabs (Rosuvastatin Calcium) .... Take One Tablet By Mouth Daily. 13)  Cymbalta 30 Mg Cpep (Duloxetine Hcl) .... One By Mouth Once Daily For Depresion 14)  Contour Blood Glucose System W/device Kit (Blood Glucose Monitoring Suppl) .... Use Two Times A Day As Directed 15)  Levemir Flexpen 100 Unit/ml Soln (Insulin Detemir) .... 50 Units Once Daily 16)  Mytussin Ac 100-10 Mg/55ml Syrp (Guaifenesin-Codeine) .... 5-10 Ml By Mouth Qid As Needed For Cough  Current Medications (verified): 1)  Carvedilol 25 Mg Tabs (Carvedilol) .... Take Two Tablets By Mouth Twice A Day 2)   Furosemide 40 Mg Tabs (Furosemide) .Marland Kitchen.. 1 Tab By Mouth Two Times A Day 3)  Altace 10 Mg Caps (Ramipril) .Marland Kitchen.. 1 By Mouth Once Daily 4)  Digoxin 0.25 Mg Tabs (Digoxin) .Marland Kitchen.. 1 Tab By Mouth Once Daily 5)  Klor-Con M20 20 Meq Cr-Tabs (Potassium Chloride Crys Cr) .Marland Kitchen.. 1 Tab By Mouth Once Daily 6)  Aspirin Ec 325 Mg Tbec (Aspirin) .... Take One Tablet By Mouth  Daily 7)  Spironolactone 25 Mg Tabs (Spironolactone) .... Take 1/2 Tablet By Mouth Two Times A Day 8)  Plavix 75 Mg Tabs (Clopidogrel Bisulfate) .... Take One Tablet By Mouth Daily 9)  Fish Oil   Oil (Fish Oil) .... 2000mg  Daily 10)  Allegra 180 Mg Tabs (Fexofenadine Hcl) .... As Needed 11)  Prevacid 15 Mg Cpdr (Lansoprazole) .... Once Daily As Needed 12)  Crestor 20 Mg Tabs (Rosuvastatin Calcium) .... Take One Tablet By Mouth Daily. 13)  Cymbalta 30 Mg Cpep (Duloxetine Hcl) .... One By Mouth Once Daily For Depresion 14)  Contour Blood Glucose System W/device Kit (Blood Glucose Monitoring Suppl) .... Use Two Times A Day As Directed 15)  Levemir Flexpen 100 Unit/ml Soln (Insulin Detemir) .... 50 Units Once Daily  Allergies (verified): 1)  ! Metformin Hcl  Past History:  Past Medical History: Last updated: 04/11/2009 1. Congestive heart failure secondary to ischemic cardiomyopathy          a. ECHO 12/09: EF 45-50% (previously 20%) 2. 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 s/p stent LAD 3. Morbid obesity 4. Diabetes.  5. Hyperlipidemia 6. GERD  Past Surgical History: Last updated: 06/27/2010 PTCA/stent  Family History: Last updated: 10/07/2008 Both of his parents are alive.  His mother is currently   age 38.  She is status post CABG at age 81.  She also has a history of   hyperlipidemia.  Father has a history of hypertension, diabetes as well   as VT and is status post ICD placement.  Denies any prior history of   coronary disease.  He has one brother and sister, both are alive and   well.   Social History: Last  updated: 10/07/2008 Single  Tobacco Use - Yes.  Alcohol Use - no Regular Exercise - no Drug Use - yes  Risk Factors: Alcohol Use: 0 (08/23/2010)  Risk Factors: Smoking Status: quit < 6 months (08/23/2010)  Family History: Reviewed history from 10/07/2008 and no changes required. Both of his parents are alive.  His mother is currently   age 25.  She is status post CABG at age 15.  She also has a history of   hyperlipidemia.  Father has a history of hypertension, diabetes as well   as VT and is status post ICD placement.  Denies any prior history of   coronary disease.  He has one brother and sister, both are alive and   well.   Social History: Reviewed history from 10/07/2008 and no changes required. Single  Tobacco Use - Yes.  Alcohol Use - no Regular Exercise - no Drug Use - yes  Review of Systems  The patient denies anorexia, fever, weight loss, weight gain, chest pain, syncope, dyspnea on exertion, peripheral edema, prolonged cough, headaches, hemoptysis, abdominal pain, hematuria, suspicious skin lesions, difficulty walking, depression, enlarged lymph nodes, and angioedema.   Endo:  Denies cold intolerance, excessive hunger, excessive thirst, excessive urination, heat intolerance, polyuria, and weight change.  Physical Exam  General:  alert, well-developed, well-nourished, well-hydrated, appropriate dress, normal appearance, healthy-appearing, cooperative to examination, good hygiene, and overweight-appearing.   Mouth:  Oral mucosa and oropharynx without lesions or exudates.  Teeth in good repair. Neck:  supple, full ROM, no masses, no thyromegaly, no thyroid nodules or tenderness, no JVD, normal carotid upstroke, no carotid bruits, no cervical lymphadenopathy, and no neck tenderness.   Lungs:  normal respiratory effort, no intercostal retractions, no accessory muscle  use, normal breath sounds, no dullness, no fremitus, no crackles, and no wheezes.   Heart:  normal rate,  regular rhythm, no murmur, no gallop, no rub, and no JVD.   Abdomen:  soft, non-tender, normal bowel sounds, no distention, no masses, no guarding, no rigidity, no rebound tenderness, no abdominal hernia, no inguinal hernia, no hepatomegaly, and no splenomegaly.   Msk:  No deformity or scoliosis noted of thoracic or lumbar spine.   Pulses:  R and L carotid,radial,femoral,dorsalis pedis and posterior tibial pulses are full and equal bilaterally Extremities:  trace left pedal edema and trace right pedal edema.   Neurologic:  No cranial nerve deficits noted. Station and gait are normal. Plantar reflexes are down-going bilaterally. DTRs are symmetrical throughout. Sensory, motor and coordinative functions appear intact.  Diabetes Management Exam:    Foot Exam (with socks and/or shoes not present):       Sensory-Pinprick/Light touch:          Left medial foot (L-4): normal          Left dorsal foot (L-5): normal          Left lateral foot (S-1): normal          Right medial foot (L-4): normal          Right dorsal foot (L-5): normal          Right lateral foot (S-1): normal       Sensory-Monofilament:          Left foot: normal          Right foot: normal       Inspection:          Left foot: normal          Right foot: normal       Nails:          Left foot: normal          Right foot: normal   Impression & Recommendations:  Problem # 1:  DYSMETABOLIC SYNDROME (ICD-277.7) Assessment Improved  Problem # 2:  DIABETES MELLITUS, TYPE II (ICD-250.00) Assessment: Unchanged  His updated medication list for this problem includes:    Altace 10 Mg Caps (Ramipril) .Marland Kitchen... 1 by mouth once daily    Aspirin Ec 325 Mg Tbec (Aspirin) .Marland Kitchen... Take one tablet by mouth daily    Levemir Flexpen 100 Unit/ml Soln (Insulin detemir) .Marland KitchenMarland KitchenMarland KitchenMarland Kitchen 50 units once daily  Orders: Venipuncture (16109) TLB-BMP (Basic Metabolic Panel-BMET) (80048-METABOL) TLB-Lipid Panel (80061-LIPID) TLB-A1C / Hgb A1C (Glycohemoglobin)  (83036-A1C)  Problem # 3:  HYPERTENSION, BENIGN (ICD-401.1) Assessment: Improved  His updated medication list for this problem includes:    Carvedilol 25 Mg Tabs (Carvedilol) .Marland Kitchen... Take two tablets by mouth twice a day    Furosemide 40 Mg Tabs (Furosemide) .Marland Kitchen... 1 tab by mouth two times a day    Altace 10 Mg Caps (Ramipril) .Marland Kitchen... 1 by mouth once daily    Spironolactone 25 Mg Tabs (Spironolactone) .Marland Kitchen... Take 1/2 tablet by mouth two times a day  Orders: Venipuncture (60454) TLB-BMP (Basic Metabolic Panel-BMET) (80048-METABOL) TLB-Lipid Panel (80061-LIPID) TLB-A1C / Hgb A1C (Glycohemoglobin) (83036-A1C)  BP today: 142/88 Prior BP: 110/84 (07/24/2010)  Prior 10 Yr Risk Heart Disease: N/A (06/27/2010)  Labs Reviewed: K+: 4.4 (06/27/2010) Creat: : 0.6 (06/27/2010)   Chol: 235 (06/18/2010)   HDL: 30.40 (06/18/2010)   LDL: DEL (08/03/2008)   TG: 490.0 Triglyceride is over 400; calculations on Lipids are invalid. mg/dL (09/81/1914)  Problem # 4:  HYPERLIPIDEMIA-MIXED (ICD-272.4) Assessment: Unchanged  His updated medication list for this problem includes:    Crestor 20 Mg Tabs (Rosuvastatin calcium) .Marland Kitchen... Take one tablet by mouth daily.  Orders: Venipuncture (54098) TLB-BMP (Basic Metabolic Panel-BMET) (80048-METABOL) TLB-Lipid Panel (80061-LIPID) TLB-A1C / Hgb A1C (Glycohemoglobin) (83036-A1C)  Labs Reviewed: SGOT: 18 (06/27/2010)   SGPT: 26 (06/27/2010)  Lipid Goals: Chol Goal: 200 (06/27/2010)   HDL Goal: 40 (06/27/2010)   LDL Goal: 70 (06/27/2010)   TG Goal: 150 (06/27/2010)  Prior 10 Yr Risk Heart Disease: N/A (06/27/2010)   HDL:30.40 (06/18/2010), 29.00 (12/04/2009)  LDL:DEL (08/03/2008), DEL (04/27/2008)  Chol:235 (06/18/2010), 201 (12/04/2009)  Trig:490.0 Triglyceride is over 400; calculations on Lipids are invalid. mg/dL (11/91/4782), 956.2 (13/03/6577)  Complete Medication List: 1)  Carvedilol 25 Mg Tabs (Carvedilol) .... Take two tablets by mouth twice a day 2)   Furosemide 40 Mg Tabs (Furosemide) .Marland Kitchen.. 1 tab by mouth two times a day 3)  Altace 10 Mg Caps (Ramipril) .Marland Kitchen.. 1 by mouth once daily 4)  Digoxin 0.25 Mg Tabs (Digoxin) .Marland Kitchen.. 1 tab by mouth once daily 5)  Klor-con M20 20 Meq Cr-tabs (Potassium chloride crys cr) .Marland Kitchen.. 1 tab by mouth once daily 6)  Aspirin Ec 325 Mg Tbec (Aspirin) .... Take one tablet by mouth daily 7)  Spironolactone 25 Mg Tabs (Spironolactone) .... Take 1/2 tablet by mouth two times a day 8)  Plavix 75 Mg Tabs (Clopidogrel bisulfate) .... Take one tablet by mouth daily 9)  Fish Oil Oil (Fish oil) .... 2000mg  daily 10)  Allegra 180 Mg Tabs (Fexofenadine hcl) .... As needed 11)  Prevacid 15 Mg Cpdr (Lansoprazole) .... Once daily as needed 12)  Crestor 20 Mg Tabs (Rosuvastatin calcium) .... Take one tablet by mouth daily. 13)  Cymbalta 30 Mg Cpep (Duloxetine hcl) .... One by mouth once daily for depresion 14)  Contour Blood Glucose System W/device Kit (Blood glucose monitoring suppl) .... Use two times a day as directed 15)  Levemir Flexpen 100 Unit/ml Soln (Insulin detemir) .... 50 units once daily  Lipid Assessment/Plan:      Based on NCEP/ATP III, the patient's risk factor category is "history of coronary disease, peripheral vascular disease, cerebrovascular disease, or aortic aneurysm along with either diabetes, current smoker, or LDL > 130 plus HDL < 40 plus triglycerides > 200".  The patient's lipid goals are as follows: Total cholesterol goal is 200; LDL cholesterol goal is 70; HDL cholesterol goal is 40; Triglyceride goal is 150.    Patient Instructions: 1)  Please schedule a follow-up appointment in 3 months. 2)  Tobacco is very bad for your health and your loved ones! You Should stop smoking!. 3)  Stop Smoking Tips: Choose a Quit date. Cut down before the Quit date. decide what you will do as a substitute when you feel the urge to smoke(gum,toothpick,exercise). 4)  It is important that you exercise regularly at least 20  minutes 5 times a week. If you develop chest pain, have severe difficulty breathing, or feel very tired , stop exercising immediately and seek medical attention. 5)  You need to lose weight. Consider a lower calorie diet and regular exercise.  6)  Check your blood sugars regularly. If your readings are usually above 200 or below 70 you should contact our office. 7)  It is important that your Diabetic A1c level is checked every 3 months. 8)  See your eye doctor yearly to check for diabetic eye damage. 9)  Check your feet each night for  sore areas, calluses or signs of infection. 10)  Check your Blood Pressure regularly. If it is above 130/80: you should make an appointment.   Orders Added: 1)  Venipuncture [36415] 2)  TLB-BMP (Basic Metabolic Panel-BMET) [80048-METABOL] 3)  TLB-Lipid Panel [80061-LIPID] 4)  TLB-A1C / Hgb A1C (Glycohemoglobin) [83036-A1C] 5)  Est. Patient Level III [04540]

## 2010-09-20 NOTE — Assessment & Plan Note (Signed)
Summary: PAIN IN FEET AND LEGS/NWS  #   Vital Signs:  Patient profile:   35 year old male Height:      74 inches Weight:      304 pounds BMI:     39.17 O2 Sat:      98 % on Room air Temp:     98.8 degrees F oral Pulse rate:   82 / minute Pulse rhythm:   regular Resp:     16 per minute BP sitting:   120 / 88  (left arm) Cuff size:   large  Vitals Entered By: Rock Nephew CMA (September 04, 2010 11:07 AM)  Nutrition Counseling: Patient's BMI is greater than 25 and therefore counseled on weight management options.  O2 Flow:  Room air CC: Patient c/o foot pain and swelling Is Patient Diabetic? Yes Did you bring your meter with you today? No  Does patient need assistance? Functional Status Self care Ambulation Normal   Primary Care Provider:  Etta Grandchild MD  CC:  Patient c/o foot pain and swelling.  History of Present Illness: He returns c/o pain in his left foot for several months with no trauma. He has an aching pain across his metatarsals. He has tired Ibuprofen without much relief. He says there was some swelling in the past but none currently. None of his other joints bother him.  He says that Cymbalta has not helped him so he wants to stop taking it.  Preventive Screening-Counseling & Management  Alcohol-Tobacco     Alcohol drinks/day: 0     Alcohol Counseling: not indicated; patient does not drink     Smoking Status: quit < 6 months     Smoking Cessation Counseling: yes     Tobacco Counseling: to remain off tobacco products  Hep-HIV-STD-Contraception     Hepatitis Risk: no risk noted     HIV Risk: no risk noted     STD Risk: no risk noted     Dental Visit-last 6 months yes     Dental Care Counseling: to seek dental care; no dental care within six months     TSE monthly: yes     Testicular SE Education/Counseling to perform regular STE     Sun Exposure-Excessive: no      Sexual History:  currently monogamous.        Drug Use:  never.        Blood  Transfusions:  no.    Clinical Review Panels:  Immunizations   Last Tetanus Booster:  Tdap (06/27/2010)   Last Flu Vaccine:  Fluvax 3+ (06/27/2010)  Lipid Management   Cholesterol:  226 (08/23/2010)   LDL (bad choesterol):  DEL (08/03/2008)   HDL (good cholesterol):  31.00 (08/23/2010)  Diabetes Management   HgBA1C:  10.9 (08/23/2010)   Creatinine:  0.7 (08/23/2010)   Last Dilated Eye Exam:  diabetic retinopathy (07/04/2010)   Last Foot Exam:  yes (09/04/2010)   Last Flu Vaccine:  Fluvax 3+ (06/27/2010)  CBC   WBC:  7.5 (06/27/2010)   RBC:  5.26 (06/27/2010)   Hgb:  17.9 (06/27/2010)   Hct:  49.9 (06/27/2010)   Platelets:  214.0 (06/27/2010)   MCV  94.9 (06/27/2010)   MCHC  35.9 (06/27/2010)   RDW  12.1 (06/27/2010)   PMN:  64.6 (06/27/2010)   Lymphs:  24.4 (06/27/2010)   Monos:  6.5 (06/27/2010)   Eosinophils:  3.2 (06/27/2010)   Basophil:  1.3 (06/27/2010)  Complete Metabolic Panel   Glucose:  313 (08/23/2010)   Sodium:  136 (08/23/2010)   Potassium:  4.3 (08/23/2010)   Chloride:  101 (08/23/2010)   CO2:  26 (08/23/2010)   BUN:  9 (08/23/2010)   Creatinine:  0.7 (08/23/2010)   Albumin:  3.9 (06/27/2010)   Total Protein:  6.8 (06/27/2010)   Calcium:  9.2 (08/23/2010)   Total Bili:  1.0 (06/27/2010)   Alk Phos:  88 (06/27/2010)   SGPT (ALT):  26 (06/27/2010)   SGOT (AST):  18 (06/27/2010)   Medications Prior to Update: 1)  Carvedilol 25 Mg Tabs (Carvedilol) .... Take Two Tablets By Mouth Twice A Day 2)  Furosemide 40 Mg Tabs (Furosemide) .Marland Kitchen.. 1 Tab By Mouth Two Times A Day 3)  Altace 10 Mg Caps (Ramipril) .Marland Kitchen.. 1 By Mouth Once Daily 4)  Digoxin 0.25 Mg Tabs (Digoxin) .Marland Kitchen.. 1 Tab By Mouth Once Daily 5)  Klor-Con M20 20 Meq Cr-Tabs (Potassium Chloride Crys Cr) .Marland Kitchen.. 1 Tab By Mouth Once Daily 6)  Aspirin Ec 325 Mg Tbec (Aspirin) .... Take One Tablet By Mouth Daily 7)  Spironolactone 25 Mg Tabs (Spironolactone) .... Take 1/2 Tablet By Mouth Two Times A Day 8)   Plavix 75 Mg Tabs (Clopidogrel Bisulfate) .... Take One Tablet By Mouth Daily 9)  Fish Oil   Oil (Fish Oil) .... 2000mg  Daily 10)  Allegra 180 Mg Tabs (Fexofenadine Hcl) .... As Needed 11)  Prevacid 15 Mg Cpdr (Lansoprazole) .... Once Daily As Needed 12)  Crestor 20 Mg Tabs (Rosuvastatin Calcium) .... Take One Tablet By Mouth Daily. 13)  Cymbalta 30 Mg Cpep (Duloxetine Hcl) .... One By Mouth Once Daily For Depresion 14)  Contour Blood Glucose System W/device Kit (Blood Glucose Monitoring Suppl) .... Use Two Times A Day As Directed 15)  Levemir Flexpen 100 Unit/ml Soln (Insulin Detemir) .... 50 Units Once Daily 16)  Glimepiride 4 Mg Tabs (Glimepiride) .... One By Mouth Once Daily For Diabetes  Current Medications (verified): 1)  Carvedilol 25 Mg Tabs (Carvedilol) .... Take Two Tablets By Mouth Twice A Day 2)  Furosemide 40 Mg Tabs (Furosemide) .Marland Kitchen.. 1 Tab By Mouth Two Times A Day 3)  Altace 10 Mg Caps (Ramipril) .Marland Kitchen.. 1 By Mouth Once Daily 4)  Digoxin 0.25 Mg Tabs (Digoxin) .Marland Kitchen.. 1 Tab By Mouth Once Daily 5)  Klor-Con M20 20 Meq Cr-Tabs (Potassium Chloride Crys Cr) .Marland Kitchen.. 1 Tab By Mouth Once Daily 6)  Aspirin Ec 325 Mg Tbec (Aspirin) .... Take One Tablet By Mouth Daily 7)  Spironolactone 25 Mg Tabs (Spironolactone) .... Take 1/2 Tablet By Mouth Two Times A Day 8)  Plavix 75 Mg Tabs (Clopidogrel Bisulfate) .... Take One Tablet By Mouth Daily 9)  Fish Oil   Oil (Fish Oil) .... 2000mg  Daily 10)  Allegra 180 Mg Tabs (Fexofenadine Hcl) .... As Needed 11)  Prevacid 15 Mg Cpdr (Lansoprazole) .... Once Daily As Needed 12)  Crestor 20 Mg Tabs (Rosuvastatin Calcium) .... Take One Tablet By Mouth Daily. 13)  Contour Blood Glucose System W/device Kit (Blood Glucose Monitoring Suppl) .... Use Two Times A Day As Directed 14)  Levemir Flexpen 100 Unit/ml Soln (Insulin Detemir) .... 50 Units Once Daily 15)  Glimepiride 4 Mg Tabs (Glimepiride) .... One By Mouth Once Daily For Diabetes 16)  Tramadol Hcl 50 Mg Tabs  (Tramadol Hcl) .... One By Mouth Qid As Needed For Pain  Allergies (verified): 1)  ! Metformin Hcl  Past History:  Past Medical History: Last updated: 04/11/2009 1. Congestive heart failure secondary  to ischemic cardiomyopathy          a. ECHO 12/09: EF 45-50% (previously 20%) 2. 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 s/p stent LAD 3. Morbid obesity 4. Diabetes.  5. Hyperlipidemia 6. GERD  Past Surgical History: Last updated: 06/27/2010 PTCA/stent  Family History: Last updated: 10/07/2008 Both of his parents are alive.  His mother is currently   age 27.  She is status post CABG at age 34.  She also has a history of   hyperlipidemia.  Father has a history of hypertension, diabetes as well   as VT and is status post ICD placement.  Denies any prior history of   coronary disease.  He has one brother and sister, both are alive and   well.   Social History: Last updated: 10/07/2008 Single  Tobacco Use - Yes.  Alcohol Use - no Regular Exercise - no Drug Use - yes  Risk Factors: Alcohol Use: 0 (09/04/2010)  Risk Factors: Smoking Status: quit < 6 months (09/04/2010)  Family History: Reviewed history from 10/07/2008 and no changes required. Both of his parents are alive.  His mother is currently   age 8.  She is status post CABG at age 54.  She also has a history of   hyperlipidemia.  Father has a history of hypertension, diabetes as well   as VT and is status post ICD placement.  Denies any prior history of   coronary disease.  He has one brother and sister, both are alive and   well.   Social History: Reviewed history from 10/07/2008 and no changes required. Single  Tobacco Use - Yes.  Alcohol Use - no Regular Exercise - no Drug Use - yes  Review of Systems       The patient complains of weight gain.  The patient denies anorexia, fever, weight loss, chest pain, syncope, dyspnea on exertion, peripheral edema, prolonged cough, headaches,  hemoptysis, abdominal pain, hematuria, suspicious skin lesions, difficulty walking, depression, and enlarged lymph nodes.   MS:  Complains of joint pain; denies joint redness, joint swelling, loss of strength, low back pain, and muscle aches. Psych:  Denies anxiety, depression, easily angered, easily tearful, irritability, mental problems, panic attacks, sense of great danger, suicidal thoughts/plans, and thoughts of violence. Endo:  Denies cold intolerance, excessive hunger, excessive thirst, excessive urination, heat intolerance, polyuria, and weight change.  Physical Exam  General:  alert, well-developed, well-nourished, well-hydrated, appropriate dress, normal appearance, healthy-appearing, cooperative to examination, good hygiene, and overweight-appearing.   Head:  normocephalic, atraumatic, no abnormalities observed, and no abnormalities palpated.   Mouth:  Oral mucosa and oropharynx without lesions or exudates.  Teeth in good repair. Neck:  supple, full ROM, no masses, no thyromegaly, no thyroid nodules or tenderness, no JVD, normal carotid upstroke, no carotid bruits, no cervical lymphadenopathy, and no neck tenderness.   Lungs:  normal respiratory effort, no intercostal retractions, no accessory muscle use, normal breath sounds, no dullness, no fremitus, no crackles, and no wheezes.   Heart:  normal rate, regular rhythm, no murmur, no gallop, no rub, and no JVD.   Abdomen:  soft, non-tender, normal bowel sounds, no distention, no masses, no guarding, no rigidity, and no rebound tenderness.   Msk:  normal ROM, no joint tenderness, no joint swelling, no joint warmth, no redness over joints, no joint deformities, no joint instability, no crepitation, and no muscle atrophy.   Pulses:  R and L carotid,radial,femoral,dorsalis  pedis and posterior tibial pulses are full and equal bilaterally Extremities:  trace left pedal edema and trace right pedal edema.   Neurologic:  No cranial nerve deficits  noted. Station and gait are normal. Plantar reflexes are down-going bilaterally. DTRs are symmetrical throughout. Sensory, motor and coordinative functions appear intact. Skin:  turgor normal, color normal, no rashes, no suspicious lesions, no ecchymoses, no petechiae, no purpura, no ulcerations, no edema, and tattoo(s).   Cervical Nodes:  no anterior cervical adenopathy and no posterior cervical adenopathy.   Psych:  Oriented X3, memory intact for recent and remote, normally interactive, good eye contact, not anxious appearing, not depressed appearing, not agitated, and not suicidal.    Diabetes Management Exam:    Foot Exam (with socks and/or shoes not present):       Sensory-Pinprick/Light touch:          Left medial foot (L-4): normal          Left dorsal foot (L-5): normal          Left lateral foot (S-1): normal          Right medial foot (L-4): normal          Right dorsal foot (L-5): normal          Right lateral foot (S-1): normal       Sensory-Monofilament:          Left foot: normal          Right foot: normal       Inspection:          Left foot: normal          Right foot: normal       Nails:          Left foot: normal          Right foot: normal   Impression & Recommendations:  Problem # 1:  FOOT PAIN, LEFT (ICD-729.5) will check for DJD, etc. Try tramadol for pain. Orders: T-Foot Left Min 3 Views (73630TC)  Problem # 2:  DIABETES MELLITUS, TYPE II (ICD-250.00) Assessment: Deteriorated  His updated medication list for this problem includes:    Altace 10 Mg Caps (Ramipril) .Marland Kitchen... 1 by mouth once daily    Aspirin Ec 325 Mg Tbec (Aspirin) .Marland Kitchen... Take one tablet by mouth daily    Levemir Flexpen 100 Unit/ml Soln (Insulin detemir) .Marland KitchenMarland KitchenMarland KitchenMarland Kitchen 50 units once daily    Glimepiride 4 Mg Tabs (Glimepiride) ..... One by mouth once daily for diabetes  Labs Reviewed: Creat: 0.7 (08/23/2010)     Last Eye Exam: diabetic retinopathy (07/04/2010) Reviewed HgBA1c results: 10.9  (08/23/2010)  11.4 (06/27/2010)  Problem # 3:  HYPERTENSION, BENIGN (ICD-401.1) Assessment: Unchanged  His updated medication list for this problem includes:    Carvedilol 25 Mg Tabs (Carvedilol) .Marland Kitchen... Take two tablets by mouth twice a day    Furosemide 40 Mg Tabs (Furosemide) .Marland Kitchen... 1 tab by mouth two times a day    Altace 10 Mg Caps (Ramipril) .Marland Kitchen... 1 by mouth once daily    Spironolactone 25 Mg Tabs (Spironolactone) .Marland Kitchen... 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 One by mouth once daily for depresion  Discussed treatment options, including trial of antidpressant medication. Will refer to behavioral health. Follow-up call in in 24-48 hours and recheck in 2 weeks, sooner as needed. Patient agrees to call if any worsening of symptoms or thoughts of doing harm arise. Verified that the patient has no suicidal ideation at this time.   Complete Medication List: 1)  Carvedilol 25 Mg Tabs (Carvedilol) .... Take two tablets by mouth twice a day 2)  Furosemide 40 Mg Tabs (Furosemide) .Marland Kitchen.. 1 tab by mouth two times a day 3)  Altace 10 Mg Caps (Ramipril) .Marland Kitchen.. 1 by mouth once daily 4)  Digoxin 0.25 Mg Tabs (Digoxin) .Marland Kitchen.. 1 tab by mouth once daily 5)  Klor-con M20 20 Meq Cr-tabs (Potassium chloride crys cr) .Marland Kitchen.. 1 tab by mouth once daily 6)  Aspirin Ec 325 Mg Tbec (Aspirin) .... Take one tablet by mouth daily 7)  Spironolactone 25 Mg Tabs (Spironolactone) .... Take 1/2 tablet by mouth two times a day 8)  Plavix 75 Mg Tabs (Clopidogrel  bisulfate) .... Take one tablet by mouth daily 9)  Fish Oil Oil (Fish oil) .... 2000mg  daily 10)  Allegra 180 Mg Tabs (Fexofenadine hcl) .... As needed 11)  Prevacid 15 Mg Cpdr (Lansoprazole) .... Once daily as needed 12)  Crestor 20 Mg Tabs (Rosuvastatin calcium) .... Take one tablet by mouth daily. 13)  Contour Blood Glucose System W/device Kit (Blood glucose monitoring suppl) .... Use two times a day as directed 14)  Levemir Flexpen 100 Unit/ml Soln (Insulin detemir) .... 50 units once daily 15)  Glimepiride 4 Mg Tabs (Glimepiride) .... One by mouth once daily for diabetes 16)  Tramadol Hcl 50 Mg Tabs (Tramadol hcl) .... One by mouth qid as needed for pain  Patient Instructions: 1)  Please schedule a follow-up appointment in 1 month. 2)  Tobacco is very bad for your health and your loved ones! You Should stop smoking!. 3)  Stop Smoking Tips: Choose a Quit date. Cut down before the Quit date. decide what you will do as a substitute when you feel the urge to smoke(gum,toothpick,exercise). 4)  It is important that you exercise regularly at least 20 minutes 5 times a week. If you develop chest pain, have severe difficulty breathing, or feel very tired , stop exercising immediately and seek medical attention. 5)  You need to lose weight. Consider a lower calorie diet and regular exercise.  6)  Check your blood sugars regularly. If your readings are usually above 200 or below 70 you should contact our office. 7)  It is important that your Diabetic A1c level is checked every 3 months. 8)  See your eye doctor yearly to check for diabetic eye damage. 9)  Check your feet each night for sore areas, calluses or signs of infection. 10)  Check your Blood Pressure regularly. If it is above 130/80: you should make an appointment. 11)  Take 650-1000mg  of Tylenol every 4-6 hours as needed for relief of pain or comfort of fever AVOID taking more than 4000mg   in a 24 hour period (can cause liver damage in higher  doses). 12)  Take 400-600mg  of Ibuprofen (Advil, Motrin) with food every 4-6 hours as needed for relief of pain or comfort of fever. 13)  You may move around but avoid painful motions. Apply ice to sore area for  20 minutes 3-4 times a day for 2-3 days. Prescriptions: TRAMADOL HCL 50 MG TABS (TRAMADOL HCL) One by mouth QID as needed for pain  #75 x 5   Entered and Authorized by:   Etta Grandchild MD   Signed by:   Etta Grandchild MD on 09/04/2010   Method used:   Print then Give to Patient   RxID:   [id number] TRAMADOL HCL 50 MG TABS (TRAMADOL HCL) One by mouth QID as needed for pain  #75 x 5   Entered and Authorized by:   Etta Grandchild MD   Signed by:   Etta Grandchild MD on 09/04/2010   Method used:   Electronically to        [facility]  [address] [phone]* (retail)       [address]       [place], [place]  [address]       Ph: [phone]       Fax: [phone]   RxID:   [id number]    Orders Added: 1)  T-Foot Left Min 3 Views [73630TC] 2)  Est. Patient Level IV [34742]

## 2010-09-20 NOTE — Progress Notes (Signed)
Summary: RESULTS  Phone Note Outgoing Call   Summary of Call: LA - his blood sugars are very high, please ask him to start the Rx glimeperide that I sent to his pharmacy  TJ Initial call taken by: Etta Grandchild MD,  August 24, 2010 7:52 AM  Follow-up for Phone Call        Pt informed, sent to correct pharm Follow-up by: Lamar Sprinkles, CMA,  August 24, 2010 9:31 AM    New/Updated Medications: GLIMEPIRIDE 4 MG TABS (GLIMEPIRIDE) One by mouth once daily for diabetes Prescriptions: GLIMEPIRIDE 4 MG TABS (GLIMEPIRIDE) One by mouth once daily for diabetes  #90 x 3   Entered by:   Lamar Sprinkles, CMA   Authorized by:   Etta Grandchild MD   Signed by:   Lamar Sprinkles, CMA on 08/24/2010   Method used:   Electronically to        [facility]* (retail)       [address] [place], [place]  [address]       Ph: [phone]       Fax: [phone]   RxID:   [id number] GLIMEPIRIDE 4 MG TABS (GLIMEPIRIDE) One by mouth once daily for diabetes  #30 x 11   Entered and Authorized by:   Etta Grandchild MD   Signed by:   Etta Grandchild MD on 08/24/2010   Method used:   [facility]ron[facility] to        [facility]  [address] [phone][facility] (retail)       [address]       [place], [place]  [address]       Ph: [phone]       Fax: [phone]   RxID:   [id number]

## 2010-09-20 NOTE — Progress Notes (Signed)
Summary: RESULTS  Phone Note Call from Patient Call back at [phone] ok vm   Summary of Call: Pt req results - heel spur, does he need further tx or referral to foot MD?  Initial call taken by: Lamar Sprinkles, CMA,  September 06, 2010 12:09 PM  Follow-up for Phone Call        i can refer if he needs it Follow-up by: Etta Grandchild MD,  September 06, 2010 12:27 PM  Additional Follow-up for Phone Call Additional follow up Details #1::        Pt informed, he will call back if he wants referral Additional Follow-up by: Lamar Sprinkles, CMA,  September 06, 2010 4:09 PM

## 2010-09-20 NOTE — Letter (Signed)
Summary: Results Follow-up Letter  [facility]  [address] [place], [place] [address]   Phone: [phone]  Fax: [phone]    08/24/2010  [address] [place], [place]  [address]  Dear Mr. Brophy,   The following are the results of your recent test(s):  Test     Result     Blood sugar     313 A1C= 10.9     way too high Kidney     normal   _________________________________________________________  Please call for an appointment in 2-3 months and start the new medicine _________________________________________________________ _________________________________________________________ _________________________________________________________  Sincerely,  Sanda Linger MD [facility]

## 2010-09-27 ENCOUNTER — Encounter: Payer: Self-pay | Admitting: Internal Medicine

## 2010-09-27 ENCOUNTER — Ambulatory Visit (INDEPENDENT_AMBULATORY_CARE_PROVIDER_SITE_OTHER): Payer: Medicare Other | Admitting: Internal Medicine

## 2010-09-27 ENCOUNTER — Telehealth: Payer: Self-pay | Admitting: Internal Medicine

## 2010-09-27 DIAGNOSIS — M79609 Pain in unspecified limb: Secondary | ICD-10-CM

## 2010-09-27 DIAGNOSIS — L03119 Cellulitis of unspecified part of limb: Secondary | ICD-10-CM

## 2010-09-27 DIAGNOSIS — L02419 Cutaneous abscess of limb, unspecified: Secondary | ICD-10-CM | POA: Insufficient documentation

## 2010-09-27 DIAGNOSIS — I10 Essential (primary) hypertension: Secondary | ICD-10-CM

## 2010-10-02 ENCOUNTER — Encounter: Payer: Self-pay | Admitting: Internal Medicine

## 2010-10-02 ENCOUNTER — Ambulatory Visit (INDEPENDENT_AMBULATORY_CARE_PROVIDER_SITE_OTHER): Payer: Medicare Other | Admitting: Internal Medicine

## 2010-10-02 DIAGNOSIS — E785 Hyperlipidemia, unspecified: Secondary | ICD-10-CM

## 2010-10-02 DIAGNOSIS — I5022 Chronic systolic (congestive) heart failure: Secondary | ICD-10-CM

## 2010-10-02 DIAGNOSIS — I2510 Atherosclerotic heart disease of native coronary artery without angina pectoris: Secondary | ICD-10-CM

## 2010-10-03 ENCOUNTER — Ambulatory Visit: Payer: Self-pay | Admitting: Internal Medicine

## 2010-10-03 ENCOUNTER — Telehealth: Payer: Self-pay | Admitting: Internal Medicine

## 2010-10-04 NOTE — Assessment & Plan Note (Signed)
Summary: SPIDER BITE?  / FOLLOW UP /NWS   Vital Signs:  Patient profile:   35 year old male Height:      74 inches Weight:      302 pounds BMI:     38.91 O2 Sat:      98 % on Room air Temp:     98.5 degrees F oral Pulse rate:   75 / minute Pulse rhythm:   regular Resp:     16 per minute BP sitting:   136 / 88  (left arm) Cuff size:   large  Vitals Entered By: Rock Nephew CMA (September 27, 2010 3:32 PM)  Nutrition Counseling: Patient's BMI is greater than 25 and therefore counseled on weight management options.  O2 Flow:  Room air CC: Patient c/o possible insect bite on R leg x 4-5 days w/ pain and redness, Hypertension Management  Does patient need assistance? Functional Status Self care Ambulation Normal   Primary Care Provider:  Etta Grandchild MD  CC:  Patient c/o possible insect bite on R leg x 4-5 days w/ pain and redness and Hypertension Management.  History of Present Illness: He returns for f/up and states that he scratched an area over his right lateral calf 5 days ago and now he complains of oozing of yellow pus with pain/redness/swelling.  Also, he wants to see a foot doctor about his foot pain.  Also, he wants to try a new antidepressant.  Hypertension History:      He denies headache, chest pain, palpitations, dyspnea with exertion, orthopnea, PND, peripheral edema, visual symptoms, neurologic problems, syncope, and side effects from treatment.  He notes no problems with any antihypertensive medication side effects.        Positive major cardiovascular risk factors include diabetes, hyperlipidemia, and hypertension.  Negative major cardiovascular risk factors include male age less than 49 years old, negative family history for ischemic heart disease, and non-tobacco-user status.        Positive history for target organ damage include ASHD (either angina/prior MI/prior CABG) and cardiac end organ damage (either CHF or LVH).  Further assessment for target  organ damage reveals no history of stroke/TIA, peripheral vascular disease, renal insufficiency, or hypertensive retinopathy.    Preventive Screening-Counseling & Management  Alcohol-Tobacco     Alcohol drinks/day: 0     Alcohol Counseling: not indicated; patient does not drink     Smoking Status: quit < 6 months     Smoking Cessation Counseling: yes     Tobacco Counseling: to remain off tobacco products  Hep-HIV-STD-Contraception     Hepatitis Risk: no risk noted     HIV Risk: no risk noted     STD Risk: no risk noted     Dental Visit-last 6 months yes     Dental Care Counseling: to seek dental care; no dental care within six months     TSE monthly: yes     Testicular SE Education/Counseling to perform regular STE     Sun Exposure-Excessive: no      Sexual History:  currently monogamous.        Drug Use:  never.        Blood Transfusions:  no.    Clinical Review Panels:  Immunizations   Last Tetanus Booster:  Tdap (06/27/2010)   Last Flu Vaccine:  Fluvax 3+ (06/27/2010)  Lipid Management   Cholesterol:  226 (08/23/2010)   LDL (bad choesterol):  DEL (08/03/2008)   HDL (good cholesterol):  31.00 (08/23/2010)  Diabetes Management   HgBA1C:  10.9 (08/23/2010)   Creatinine:  0.7 (08/23/2010)   Last Dilated Eye Exam:  diabetic retinopathy (07/04/2010)   Last Foot Exam:  yes (09/04/2010)   Last Flu Vaccine:  Fluvax 3+ (06/27/2010)  CBC   WBC:  7.5 (06/27/2010)   RBC:  5.26 (06/27/2010)   Hgb:  17.9 (06/27/2010)   Hct:  49.9 (06/27/2010)   Platelets:  214.0 (06/27/2010)   MCV  94.9 (06/27/2010)   MCHC  35.9 (06/27/2010)   RDW  12.1 (06/27/2010)   PMN:  64.6 (06/27/2010)   Lymphs:  24.4 (06/27/2010)   Monos:  6.5 (06/27/2010)   Eosinophils:  3.2 (06/27/2010)   Basophil:  1.3 (06/27/2010)  Complete Metabolic Panel   Glucose:  313 (08/23/2010)   Sodium:  136 (08/23/2010)   Potassium:  4.3 (08/23/2010)   Chloride:  101 (08/23/2010)   CO2:  26 (08/23/2010)   BUN:   9 (08/23/2010)   Creatinine:  0.7 (08/23/2010)   Albumin:  3.9 (06/27/2010)   Total Protein:  6.8 (06/27/2010)   Calcium:  9.2 (08/23/2010)   Total Bili:  1.0 (06/27/2010)   Alk Phos:  88 (06/27/2010)   SGPT (ALT):  26 (06/27/2010)   SGOT (AST):  18 (06/27/2010)   Medications Prior to Update: 1)  Carvedilol 25 Mg Tabs (Carvedilol) .... Take Two Tablets By Mouth Twice A Day 2)  Furosemide 40 Mg Tabs (Furosemide) .Marland Kitchen.. 1 Tab By Mouth Two Times A Day 3)  Altace 10 Mg Caps (Ramipril) .Marland Kitchen.. 1 By Mouth Once Daily 4)  Digoxin 0.25 Mg Tabs (Digoxin) .Marland Kitchen.. 1 Tab By Mouth Once Daily 5)  Klor-Con M20 20 Meq Cr-Tabs (Potassium Chloride Crys Cr) .Marland Kitchen.. 1 Tab By Mouth Once Daily 6)  Aspirin Ec 325 Mg Tbec (Aspirin) .... Take One Tablet By Mouth Daily 7)  Spironolactone 25 Mg Tabs (Spironolactone) .... Take 1/2 Tablet By Mouth Two Times A Day 8)  Plavix 75 Mg Tabs (Clopidogrel Bisulfate) .... Take One Tablet By Mouth Daily 9)  Fish Oil   Oil (Fish Oil) .... 2000mg  Daily 10)  Allegra 180 Mg Tabs (Fexofenadine Hcl) .... As Needed 11)  Prevacid 15 Mg Cpdr (Lansoprazole) .... Once Daily As Needed 12)  Crestor 20 Mg Tabs (Rosuvastatin Calcium) .... Take One Tablet By Mouth Daily. 13)  Contour Blood Glucose System W/device Kit (Blood Glucose Monitoring Suppl) .... Use Two Times A Day As Directed 14)  Levemir Flexpen 100 Unit/ml Soln (Insulin Detemir) .... 50 Units Once Daily 15)  Glimepiride 4 Mg Tabs (Glimepiride) .... One By Mouth Once Daily For Diabetes 16)  Tramadol Hcl 50 Mg Tabs (Tramadol Hcl) .... One By Mouth Qid As Needed For Pain  Current Medications (verified): 1)  Carvedilol 25 Mg Tabs (Carvedilol) .... Take Two Tablets By Mouth Twice A Day 2)  Furosemide 40 Mg Tabs (Furosemide) .Marland Kitchen.. 1 Tab By Mouth Two Times A Day 3)  Altace 10 Mg Caps (Ramipril) .Marland Kitchen.. 1 By Mouth Once Daily 4)  Digoxin 0.25 Mg Tabs (Digoxin) .Marland Kitchen.. 1 Tab By Mouth Once Daily 5)  Klor-Con M20 20 Meq Cr-Tabs (Potassium Chloride Crys  Cr) .Marland Kitchen.. 1 Tab By Mouth Once Daily 6)  Aspirin Ec 325 Mg Tbec (Aspirin) .... Take One Tablet By Mouth Daily 7)  Spironolactone 25 Mg Tabs (Spironolactone) .... Take 1/2 Tablet By Mouth Two Times A Day 8)  Plavix 75 Mg Tabs (Clopidogrel Bisulfate) .... Take One Tablet By Mouth Daily 9)  Fish Oil  Oil (Fish Oil) .... 2000mg  Daily 10)  Allegra 180 Mg Tabs (Fexofenadine Hcl) .... As Needed 11)  Prevacid 15 Mg Cpdr (Lansoprazole) .... Once Daily As Needed 12)  Crestor 20 Mg Tabs (Rosuvastatin Calcium) .... Take One Tablet By Mouth Daily. 13)  Contour Blood Glucose System W/device Kit (Blood Glucose Monitoring Suppl) .... Use Two Times A Day As Directed 14)  Levemir Flexpen 100 Unit/ml Soln (Insulin Detemir) .... 50 Units Once Daily 15)  Glimepiride 4 Mg Tabs (Glimepiride) .... One By Mouth Once Daily For Diabetes 16)  Tramadol Hcl 50 Mg Tabs (Tramadol Hcl) .... One By Mouth Qid As Needed For Pain 17)  Sulfamethoxazole-Tmp Ds 800-160 Mg Tab (Trimethoprim-Sulfamethoxazole) .... Take 1 Tablet By Mouth Morning and Night 18)  Ceftin 500 Mg Tabs (Cefuroxime Axetil) .... One By Mouth Two Times A Day For 10 Days 19)  Bactroban 2 % Oint (Mupirocin) .... Apply To Infection On Right Leg Two Times A Day For 10 Days 20)  Pristiq 50 Mg Xr24h-Tab (Desvenlafaxine Succinate) .... One By Mouth Once Daily  Allergies (verified): 1)  ! Metformin Hcl  Past History:  Past Medical History: Last updated: 04/11/2009 1. Congestive heart failure secondary to ischemic cardiomyopathy          a. ECHO 12/09: EF 45-50% (previously 20%) 2. 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 s/p stent LAD 3. Morbid obesity 4. Diabetes.  5. Hyperlipidemia 6. GERD  Past Surgical History: Last updated: 06/27/2010 PTCA/stent  Family History: Last updated: 10/07/2008 Both of his parents are alive.  His mother is currently   age 7.  She is status post CABG at age 40.  She also has a history of   hyperlipidemia.   Father has a history of hypertension, diabetes as well   as VT and is status post ICD placement.  Denies any prior history of   coronary disease.  He has one brother and sister, both are alive and   well.   Social History: Last updated: 10/07/2008 Single  Tobacco Use - Yes.  Alcohol Use - no Regular Exercise - no Drug Use - yes  Risk Factors: Alcohol Use: 0 (09/27/2010)  Risk Factors: Smoking Status: quit < 6 months (09/27/2010)  Family History: Reviewed history from 10/07/2008 and no changes required. Both of his parents are alive.  His mother is currently   age 49.  She is status post CABG at age 98.  She also has a history of   hyperlipidemia.  Father has a history of hypertension, diabetes as well   as VT and is status post ICD placement.  Denies any prior history of   coronary disease.  He has one brother and sister, both are alive and   well.   Social History: Reviewed history from 10/07/2008 and no changes required. Single  Tobacco Use - Yes.  Alcohol Use - no Regular Exercise - no Drug Use - yes  Review of Systems       The patient complains of suspicious skin lesions.  The patient denies anorexia, fever, weight loss, weight gain, chest pain, syncope, dyspnea on exertion, peripheral edema, prolonged cough, headaches, hemoptysis, abdominal pain, transient blindness, and enlarged lymph nodes.   MS:  Complains of joint pain; denies joint redness, joint swelling, loss of strength, muscle aches, and stiffness. Derm:  Complains of lesion(s) and rash; denies changes in color of skin, changes in nail beds, dryness, excessive perspiration, flushing, hair loss, and  itching. Psych:  Complains of depression, easily angered, easily tearful, and irritability; denies alternate hallucination ( auditory/visual), anxiety, mental problems, panic attacks, sense of great danger, suicidal thoughts/plans, thoughts of violence, unusual visions or sounds, and thoughts /plans of harming  others.  Physical Exam  General:  alert, well-developed, well-nourished, well-hydrated, appropriate dress, normal appearance, healthy-appearing, cooperative to examination, good hygiene, and overweight-appearing.   Head:  normocephalic, atraumatic, no abnormalities observed, and no abnormalities palpated.   Mouth:  Oral mucosa and oropharynx without lesions or exudates.  Teeth in good repair. Neck:  supple, full ROM, no masses, no thyromegaly, no thyroid nodules or tenderness, no JVD, normal carotid upstroke, no carotid bruits, no cervical lymphadenopathy, and no neck tenderness.   Lungs:  normal respiratory effort, no intercostal retractions, no accessory muscle use, normal breath sounds, no dullness, no fremitus, no crackles, and no wheezes.   Heart:  normal rate, regular rhythm, no murmur, no gallop, no rub, and no JVD.   Abdomen:  soft, non-tender, normal bowel sounds, no distention, no masses, no guarding, no rigidity, and no rebound tenderness.   Msk:  normal ROM, no joint tenderness, no joint swelling, no joint warmth, no redness over joints, no joint deformities, no joint instability, no crepitation, and no muscle atrophy.   Pulses:  R and L carotid,radial,femoral,dorsalis pedis and posterior tibial pulses are full and equal bilaterally Extremities:  trace left pedal edema and trace right pedal edema.   Neurologic:  No cranial nerve deficits noted. Station and gait are normal. Plantar reflexes are down-going bilaterally. DTRs are symmetrical throughout. Sensory, motor and coordinative functions appear intact. Skin:  over his right lateral calf area there is a round 2cm confluent area of erythema and scant amount of serous exudate but there is no purulent exudate, fluctuance, induration, streaking, warmth, ttp, vesicles, FB Cervical Nodes:  no anterior cervical adenopathy and no posterior cervical adenopathy.   Inguinal Nodes:  no R inguinal adenopathy and no L inguinal adenopathy.   Psych:   Oriented X3, memory intact for recent and remote, normally interactive, good eye contact, not anxious appearing, not depressed appearing, not agitated, and not suicidal.     Impression & Recommendations:  Problem # 1:  CELLULITIS, RIGHT LEG (ICD-682.6)  His updated medication list for this problem includes:    Sulfamethoxazole-tmp Ds 800-160 Mg Tab (Trimethoprim-sulfamethoxazole) .Marland Kitchen... Take 1 tablet by mouth morning and night    Ceftin 500 Mg Tabs (Cefuroxime axetil) ..... 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 One by mouth once daily  Discussed treatment options, including trial of antidpressant medication. Will refer to behavioral health. Follow-up call in in 24-48 hours and recheck in 2 weeks, sooner as needed. Patient agrees to call if any worsening of symptoms or thoughts of doing harm arise. Verified that the patient has no suicidal ideation at this time.   Problem # 4:  HYPERTENSION, BENIGN (ICD-401.1) Assessment: Unchanged  His updated medication list for this problem includes:    Carvedilol 25 Mg Tabs (Carvedilol) .Marland Kitchen... Take two tablets by mouth twice a day    Furosemide 40 Mg Tabs (Furosemide) .Marland Kitchen... 1 tab by mouth two times a day    Altace 10 Mg Caps (Ramipril) .Marland KitchenMarland KitchenMarland KitchenMarland Kitchen  1 by mouth once daily    Spironolactone 25 Mg Tabs (Spironolactone) .Marland Kitchen... Take 1/2 tablet by mouth two times a day  BP today: 136/88 Prior BP: 120/88 (09/04/2010)  Prior 10 Yr Risk Heart Disease: N/A (06/27/2010)  Labs Reviewed: K+: 4.3 (08/23/2010) Creat: : 0.7 (08/23/2010)   Chol: 226 (08/23/2010)   HDL: 31.00 (08/23/2010)   LDL: DEL (08/03/2008)   TG: 238.0 (08/23/2010)  Complete  Medication List: 1)  Carvedilol 25 Mg Tabs (Carvedilol) .... Take two tablets by mouth twice a day 2)  Furosemide 40 Mg Tabs (Furosemide) .Marland Kitchen.. 1 tab by mouth two times a day 3)  Altace 10 Mg Caps (Ramipril) .Marland Kitchen.. 1 by mouth once daily 4)  Digoxin 0.25 Mg Tabs (Digoxin) .Marland Kitchen.. 1 tab by mouth once daily 5)  Klor-con M20 20 Meq Cr-tabs (Potassium chloride crys cr) .Marland Kitchen.. 1 tab by mouth once daily 6)  Aspirin Ec 325 Mg Tbec (Aspirin) .... Take one tablet by mouth daily 7)  Spironolactone 25 Mg Tabs (Spironolactone) .... Take 1/2 tablet by mouth two times a day 8)  Plavix 75 Mg Tabs (Clopidogrel bisulfate) .... Take one tablet by mouth daily 9)  Fish Oil Oil (Fish oil) .... 2000mg  daily 10)  Allegra 180 Mg Tabs (Fexofenadine hcl) .... As needed 11)  Prevacid 15 Mg Cpdr (Lansoprazole) .... Once daily as needed 12)  Crestor 20 Mg Tabs (Rosuvastatin calcium) .... Take one tablet by mouth daily. 13)  Contour Blood Glucose System W/device Kit (Blood glucose monitoring suppl) .... Use two times a day as directed 14)  Levemir Flexpen 100 Unit/ml Soln (Insulin detemir) .... 50 units once daily 15)  Glimepiride 4 Mg Tabs (Glimepiride) .... One by mouth once daily for diabetes 16)  Tramadol Hcl 50 Mg Tabs (Tramadol hcl) .... One by mouth qid as needed for pain 17)  Sulfamethoxazole-tmp Ds 800-160 Mg Tab (Trimethoprim-sulfamethoxazole) .... Take 1 tablet by mouth morning and night 18)  Ceftin 500 Mg Tabs (Cefuroxime axetil) .... One by mouth two times a day for 10 days 19)  Bactroban 2 % Oint (Mupirocin) .... Apply to infection on right leg two times a day for 10 days 20)  Pristiq 50 Mg Xr24h-tab (Desvenlafaxine succinate) .... One by mouth once daily  Hypertension Assessment/Plan:      The patient's hypertensive risk group is category C: Target organ damage and/or diabetes.  Today's blood pressure is 136/88.  His blood pressure goal is < 130/80.   Patient Instructions: 1)  Please schedule a follow-up  appointment in 2 weeks. 2)  Take your antibiotic as prescribed until ALL of it is gone, but stop if you develop a rash or swelling and contact our office as soon as possible. Prescriptions: PRISTIQ 50 MG XR24H-TAB (DESVENLAFAXINE SUCCINATE) One by mouth once daily  #21 x 0   Entered and Authorized by:   Etta Grandchild MD   Signed by:   Etta Grandchild MD on 09/27/2010   Method used:   Samples Given   RxID:   [id number] BACTROBAN 2 % OINT (MUPIROCIN) Apply to infection on right leg two times a day for 10 days  #30 gms x 1   Entered and Authorized by:   Etta Grandchild MD   Signed by:   Etta Grandchild MD on 09/27/2010   Method used:   Electronically to        [facility]* (retail)       [address]       [place],    [address]       Ph: [phone]       Fax: [phone]   RxID:   [id number] CEFTIN 500 MG TABS (CEFUROXIME AXETIL) One by mouth two times a day for 10 days  #20 x 0   Entered and Authorized by:   Etta Grandchild MD   Signed by:   Etta Grandchild MD on 09/27/2010   Method used:   Electronically to        [facility]* (retail)       [address] [place], [place]  [address]       Ph: [phone]       Fax: [phone]   RxID:   [id number] SULFAMETHOXAZOLE-TMP DS 800-160 MG TAB (TRIMETHOPRIM-SULFAMETHOXAZOLE) Take 1 tablet by mouth morning and night  #20 x 0   Entered and Authorized by:   Etta Grandchild MD   Signed by:   Etta Grandchild MD on 09/27/2010   Method used:   Electronically to        [facility]* (retail)       [address] [place], [place]  [address]       Ph: [phone]       Fax: [phone]   RxID:   [id number]    Orders Added: 1)  Podiatry Referral [Podiatry] 2)  Est. Patient Level IV [[address]]

## 2010-10-04 NOTE — Progress Notes (Signed)
Summary: Pharmacy Name  Phone Note Call from Patient   Caller: Patient Call For: Etta Grandchild MD Summary of Call: Pt requesting pharm name prescription was called in for spider bite.  Pt ph #:  [phone] Initial call taken by: Ivar Bury,  September 27, 2010 4:10 PM  Follow-up for Phone Call        left vm on pt's cell pharm info. Follow-up by: Lamar Sprinkles, CMA,  September 27, 2010 5:00 PM

## 2010-10-10 NOTE — Assessment & Plan Note (Signed)
Summary: 4 month/hms   Visit Type:  4 month follow up Primary Provider:  Etta Grandchild MD  CC:  headache and .  History of Present Illness: [name] Foley is a 35 year old male with a history of morbid obesity, hypertension, diabetes, and congestive heart failure secondary to severe ischemic cardiomyopathy with ejection fraction of 20% previously.  Cardiac catheterization 2009 year showed severe three- vessel coronary artery disease which was not amenable to bypass surgery due to severe distal disease.  He did undergo angioplasty of a portion of his LAD.  Most recent echo in 4/11 showed EF 40%. He returns for followup.   From a cardiac standpoint doing very well. Denies any CP or dyspnea. Compliant with meds. No orthopnea, PND or edema. Tries to walk every day. Main complaint is chronic HA.  Recently started insulin. BS consistently under 200. Having trouble affording Crestor. Takinh fish oil 2 tabs once daily.   Recent lipids 1/12:  TC 226 TG 238 HDL 31 LDL 165   Current Medications (verified): 1)  Carvedilol 25 Mg Tabs (Carvedilol) .... Take Two Tablets By Mouth Twice A Day 2)  Furosemide 40 Mg Tabs (Furosemide) .Marland Kitchen.. 1 Tab By Mouth Two Times A Day 3)  Altace 10 Mg Caps (Ramipril) .Marland Kitchen.. 1 By Mouth Once Daily 4)  Digoxin 0.25 Mg Tabs (Digoxin) .Marland Kitchen.. 1 Tab By Mouth Once Daily 5)  Klor-Con M20 20 Meq Cr-Tabs (Potassium Chloride Crys Cr) .Marland Kitchen.. 1 Tab By Mouth Once Daily 6)  Aspirin Ec 325 Mg Tbec (Aspirin) .... Take One Tablet By Mouth Daily 7)  Spironolactone 25 Mg Tabs (Spironolactone) .... Take 1/2 Tablet By Mouth Two Times A Day 8)  Plavix 75 Mg Tabs (Clopidogrel Bisulfate) .... Take One Tablet By Mouth Daily 9)  Fish Oil   Oil (Fish Oil) .... 2000mg  Daily 10)  Allegra 180 Mg Tabs (Fexofenadine Hcl) .... As Needed 11)  Prevacid 15 Mg Cpdr (Lansoprazole) .... Once Daily As Needed 12)  Crestor 20 Mg Tabs (Rosuvastatin Calcium) .... Take One Tablet By Mouth Daily. 13)  Contour Blood Glucose System  W/device Kit (Blood Glucose Monitoring Suppl) .... Use Two Times A Day As Directed 14)  Levemir Flexpen 100 Unit/ml Soln (Insulin Detemir) .... 50 Units Once Daily 15)  Glimepiride 4 Mg Tabs (Glimepiride) .... One By Mouth Once Daily For Diabetes 16)  Tramadol Hcl 50 Mg Tabs (Tramadol Hcl) .... One By Mouth Qid As Needed For Pain 17)  Sulfamethoxazole-Tmp Ds 800-160 Mg Tab (Trimethoprim-Sulfamethoxazole) .... Take 1 Tablet By Mouth Morning and Night 18)  Ceftin 500 Mg Tabs (Cefuroxime Axetil) .... One By Mouth Two Times A Day For 10 Days 19)  Bactroban 2 % Oint (Mupirocin) .... Apply To Infection On Right Leg Two Times A Day For 10 Days 20)  Pristiq 50 Mg Xr24h-Tab (Desvenlafaxine Succinate) .... One By Mouth Once Daily 21)  Levemir Flexpen 100 Unit/ml Soln (Insulin Detemir) .... Use As Directed  Allergies (verified): 1)  ! Metformin Hcl  Past History:  Past Medical History: 1. Congestive heart failure secondary to ischemic cardiomyopathy          a. ECHO 12/09: EF 45-50% (previously 20%)          b. ECHO 4/11: EF 40%  2. 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 s/p stent LAD 3. Morbid obesity 4. Diabetes.  5. Hyperlipidemia 6. GERD  Review of Systems       As per HPI and past  medical history; otherwise all systems negative.   Vital Signs:  Patient profile:   35 year old male Height:      74 inches Weight:      300 pounds BMI:     38.66 Pulse rate:   78 / minute BP sitting:   108 / 74  (left arm) Cuff size:   large  Vitals Entered By: Caralee Ates CMA (October 02, 2010 2:43 PM)  Physical Exam  General:  well appearing. no resp difficulty HEENT: normal Neck: supple. no JVD. Carotids 2+ bilat; no bruits. No lymphadenopathy or thryomegaly appreciated. Cor: PMI nondisplaced. Regular rate & rhythm. No rubs, gallops, murmur. Lungs: clear Abdomen: soft, nontender, nondistended. No hepatosplenomegaly. No bruits or masses. Good bowel sounds. Extremities: no  cyanosis, clubbing, rash, edema Neuro: alert & orientedx3, cranial nerves grossly intact. moves all 4 extremities w/o difficulty. affect frustrated   Impression & Recommendations:  Problem # 1:  CAD, UNSPECIFIED SITE (ICD-414.00) Stable. No evidence of ischemia. Continue current regimen.  Problem # 2:  SYSTOLIC HEART FAILURE, CHRONIC (ICD-428.22) NYHA Class 1-2.  Volume status looks good. Continue current regimen. Given EF > 35% doesn't qualify for ICD. Due for yearly echo this summer.   Problem # 3:  HYPERLIPIDEMIA-MIXED (ICD-272.4) I suspect he is not fully compliant with Crestor. Will switch him to generic atorvastatin 80 mg once daily. Also increase fish oil to 4g daily. Stressed need to be compliant with meds to help forestall progression of CAD.   Appended Document: instructions    Clinical Lists Changes  Medications: Changed medication from CRESTOR 20 MG TABS (ROSUVASTATIN CALCIUM) Take one tablet by mouth daily. to ATORVASTATIN CALCIUM 80 MG TABS (ATORVASTATIN CALCIUM) Take 1 tablet by mouth once a day - Signed Changed medication from FISH OIL   OIL (FISH OIL) 2000mg  daily to FISH OIL   OIL (FISH OIL) 4 tabs daily - Signed Rx of ATORVASTATIN CALCIUM 80 MG TABS (ATORVASTATIN CALCIUM) Take 1 tablet by mouth once a day;  #30 x 6;  Signed;  Entered by: Meredith Staggers, RN;  Authorized by: Dolores Patty, MD, [facility];  Method used: Electronically to [facility]*, [address] [place], [place]  [address], Ph: [phone], Fax: [phone] Orders: Added new Service order of EKG w/ Interpretation (93000) - Signed Added new Referral order of Echocardiogram (Echo) - Signed Observations: Added new observation of PI CARDIO: Stop Crestor and Start Atorvastatin 80mg  daily Increase Fish Oil to 4 tabs daily Your physician has requested that you have an echocardiogram.  Echocardiography is a painless test that uses sound waves to create images of your heart. It provides your doctor with  information about the size and shape of your heart and how well your heart's chambers and valves are working.  This procedure takes approximately one hour. There are no restrictions for this procedure.  Needs in 4 months. Follow up in 4 months. (10/02/2010 15:12)    Prescriptions: ATORVASTATIN CALCIUM 80 MG TABS (ATORVASTATIN CALCIUM) Take 1 tablet by mouth once a day  #30 x 6   Entered by:   Meredith Staggers, RN   Authorized by:   Dolores Patty, MD, [facility]   Signed by:   Meredith Staggers, RN on 10/02/2010   Method used:   Electronically to        [facility]* (retail)       [address] [place], [place]  [address]       Ph: [phone]  Fax: [phone]   RxID:   [id number]     Patient Instructions: 1)  Stop Crestor and Start Atorvastatin 80mg  daily 2)  Increase Fish Oil to 4 tabs daily 3)  Your physician has requested that you have an echocardiogram.  Echocardiography is a painless test that uses sound waves to create images of your heart. It provides your doctor with information about the size and shape of your heart and how well your heart's chambers and valves are working.  This procedure takes approximately one hour. There are no restrictions for this procedure.  Needs in 4 months. 4)  Follow up in 4 months.

## 2010-10-11 ENCOUNTER — Encounter: Payer: Self-pay | Admitting: Internal Medicine

## 2010-10-11 ENCOUNTER — Ambulatory Visit (INDEPENDENT_AMBULATORY_CARE_PROVIDER_SITE_OTHER): Payer: Medicare Other | Admitting: Internal Medicine

## 2010-10-11 DIAGNOSIS — M1990 Unspecified osteoarthritis, unspecified site: Secondary | ICD-10-CM | POA: Insufficient documentation

## 2010-10-11 DIAGNOSIS — M79609 Pain in unspecified limb: Secondary | ICD-10-CM

## 2010-10-11 DIAGNOSIS — F329 Major depressive disorder, single episode, unspecified: Secondary | ICD-10-CM

## 2010-10-11 DIAGNOSIS — E119 Type 2 diabetes mellitus without complications: Secondary | ICD-10-CM

## 2010-10-11 DIAGNOSIS — L02419 Cutaneous abscess of limb, unspecified: Secondary | ICD-10-CM

## 2010-10-11 DIAGNOSIS — I10 Essential (primary) hypertension: Secondary | ICD-10-CM

## 2010-10-16 NOTE — Progress Notes (Signed)
Summary: question on meds   ASK DB  cant afford atorvastatin  Phone Note Call from Patient Call back at [phone]   Reason for Call: Talk to Nurse Summary of Call: pt has question re meds that were called in yesterday Initial call taken by: Roe Coombs,  October 03, 2010 11:20 AM  Follow-up for Phone Call        atrovastatin is going to cost him around $150 and he can not pay for it.  Needs a different medication as he has no insurance coverage for medications.  (The medication assistance program maybe able to help [phone].  Will discuss with Dr [name] and call pt back.  Pt  is agreeable Follow-up by: Charolotte Capuchin, RN,  October 03, 2010 5:30 PM  Additional Follow-up for Phone Call Additional follow up Details #1::        if cant get pt assistance use simva 40.  Dolores Patty, MD, [facility]  October 03, 2010 5:40 PM  spoke w/pt he states he has medicare but no drug coverage, he is bringing me plavix application tom. will also have him fill out lipitor asst. program form Meredith Staggers, RN  October 10, 2010 3:31 PM      Additional Follow-up for Phone Call Additional follow up Details #2::    pt came by this morning and dropped off plavix form and proof of income, he completed and signed lipitor form, applications faxed to companies Meredith Staggers, RN  October 11, 2010 4:46 PM

## 2010-10-16 NOTE — Assessment & Plan Note (Signed)
Summary: 2 WK FU Natale Milch  #   Vital Signs:  Patient profile:   35 year old male Height:      74 inches Weight:      304.25 pounds BMI:     39.20 O2 Sat:      98 % on Room air Temp:     98.7 degrees F oral Pulse rate:   72 / minute Pulse rhythm:   regular Resp:     16 per minute BP sitting:   118 / 82  (left arm) Cuff size:   large  Vitals Entered By: Rock Nephew CMA (October 11, 2010 10:32 AM)  Nutrition Counseling: Patient's BMI is greater than 25 and therefore counseled on weight management options.  O2 Flow:  Room air CC: follow-up visit Is Patient Diabetic? Yes  Does patient need assistance? Functional Status Self care Ambulation Normal   Primary Care Provider:  Etta Grandchild MD  CC:  follow-up visit.  History of Present Illness: He returns for f/up and he tells me that the lesion on his right calf has healed.  He still has foot pain and wants to see a Podiatrist (he has not done so yet.)  Preventive Screening-Counseling & Management  Alcohol-Tobacco     Alcohol drinks/day: 0     Alcohol Counseling: not indicated; patient does not drink     Smoking Status: quit < 6 months     Smoking Cessation Counseling: yes     Tobacco Counseling: to remain off tobacco products  Hep-HIV-STD-Contraception     Hepatitis Risk: no risk noted     HIV Risk: no risk noted     STD Risk: no risk noted     Dental Visit-last 6 months yes     Dental Care Counseling: to seek dental care; no dental care within six months     TSE monthly: yes     Testicular SE Education/Counseling to perform regular STE     Sun Exposure-Excessive: no      Sexual History:  currently monogamous.        Drug Use:  never.        Blood Transfusions:  no.    Clinical Review Panels:  Immunizations   Last Tetanus Booster:  Tdap (06/27/2010)   Last Flu Vaccine:  Fluvax 3+ (06/27/2010)  Lipid Management   Cholesterol:  226 (08/23/2010)   LDL (bad choesterol):  DEL (08/03/2008)   HDL (good  cholesterol):  60.45 (08/23/2010)  Diabetes Management   HgBA1C:  10.9 (08/23/2010)   Creatinine:  0.7 (08/23/2010)   Last Dilated Eye Exam:  diabetic retinopathy (07/04/2010)   Last Foot Exam:  yes (09/04/2010)   Last Flu Vaccine:  Fluvax 3+ (06/27/2010)  CBC   WBC:  7.5 (06/27/2010)   RBC:  5.26 (06/27/2010)   Hgb:  17.9 (06/27/2010)   Hct:  49.9 (06/27/2010)   Platelets:  214.0 (06/27/2010)   MCV  94.9 (06/27/2010)   MCHC  35.9 (06/27/2010)   RDW  12.1 (06/27/2010)   PMN:  64.6 (06/27/2010)   Lymphs:  24.4 (06/27/2010)   Monos:  6.5 (06/27/2010)   Eosinophils:  3.2 (06/27/2010)   Basophil:  1.3 (06/27/2010)  Complete Metabolic Panel   Glucose:  313 (08/23/2010)   Sodium:  136 (08/23/2010)   Potassium:  4.3 (08/23/2010)   Chloride:  101 (08/23/2010)   CO2:  26 (08/23/2010)   BUN:  9 (08/23/2010)   Creatinine:  0.7 (08/23/2010)   Albumin:  3.9 (06/27/2010)   Total  Protein:  6.8 (06/27/2010)   Calcium:  9.2 (08/23/2010)   Total Bili:  1.0 (06/27/2010)   Alk Phos:  88 (06/27/2010)   SGPT (ALT):  26 (06/27/2010)   SGOT (AST):  18 (06/27/2010)   Medications Prior to Update: 1)  Carvedilol 25 Mg Tabs (Carvedilol) .... Take Two Tablets By Mouth Twice A Day 2)  Furosemide 40 Mg Tabs (Furosemide) .Marland Kitchen.. 1 Tab By Mouth Two Times A Day 3)  Altace 10 Mg Caps (Ramipril) .Marland Kitchen.. 1 By Mouth Once Daily 4)  Digoxin 0.25 Mg Tabs (Digoxin) .Marland Kitchen.. 1 Tab By Mouth Once Daily 5)  Klor-Con M20 20 Meq Cr-Tabs (Potassium Chloride Crys Cr) .Marland Kitchen.. 1 Tab By Mouth Once Daily 6)  Aspirin Ec 325 Mg Tbec (Aspirin) .... Take One Tablet By Mouth Daily 7)  Spironolactone 25 Mg Tabs (Spironolactone) .... Take 1/2 Tablet By Mouth Two Times A Day 8)  Plavix 75 Mg Tabs (Clopidogrel Bisulfate) .... Take One Tablet By Mouth Daily 9)  Fish Oil   Oil (Fish Oil) .... 4 Tabs Daily 10)  Allegra 180 Mg Tabs (Fexofenadine Hcl) .... As Needed 11)  Prevacid 15 Mg Cpdr (Lansoprazole) .... Once Daily As Needed 12)   Atorvastatin Calcium 80 Mg Tabs (Atorvastatin Calcium) .... Take 1 Tablet By Mouth Once A Day 13)  Contour Blood Glucose System W/device Kit (Blood Glucose Monitoring Suppl) .... Use Two Times A Day As Directed 14)  Levemir Flexpen 100 Unit/ml Soln (Insulin Detemir) .... 50 Units Once Daily 15)  Glimepiride 4 Mg Tabs (Glimepiride) .... One By Mouth Once Daily For Diabetes 16)  Tramadol Hcl 50 Mg Tabs (Tramadol Hcl) .... One By Mouth Qid As Needed For Pain 17)  Bactroban 2 % Oint (Mupirocin) .... Apply To Infection On Right Leg Two Times A Day For 10 Days 18)  Pristiq 50 Mg Xr24h-Tab (Desvenlafaxine Succinate) .... One By Mouth Once Daily 19)  Levemir Flexpen 100 Unit/ml Soln (Insulin Detemir) .... Use As Directed  Current Medications (verified): 1)  Carvedilol 25 Mg Tabs (Carvedilol) .... Take Two Tablets By Mouth Twice A Day 2)  Furosemide 40 Mg Tabs (Furosemide) .Marland Kitchen.. 1 Tab By Mouth Two Times A Day 3)  Altace 10 Mg Caps (Ramipril) .Marland Kitchen.. 1 By Mouth Once Daily 4)  Digoxin 0.25 Mg Tabs (Digoxin) .Marland Kitchen.. 1 Tab By Mouth Once Daily 5)  Klor-Con M20 20 Meq Cr-Tabs (Potassium Chloride Crys Cr) .Marland Kitchen.. 1 Tab By Mouth Once Daily 6)  Aspirin Ec 325 Mg Tbec (Aspirin) .... Take One Tablet By Mouth Daily 7)  Spironolactone 25 Mg Tabs (Spironolactone) .... Take 1/2 Tablet By Mouth Two Times A Day 8)  Plavix 75 Mg Tabs (Clopidogrel Bisulfate) .... Take One Tablet By Mouth Daily 9)  Fish Oil   Oil (Fish Oil) .... 4 Tabs Daily 10)  Allegra 180 Mg Tabs (Fexofenadine Hcl) .... As Needed 11)  Prevacid 15 Mg Cpdr (Lansoprazole) .... Once Daily As Needed 12)  Atorvastatin Calcium 80 Mg Tabs (Atorvastatin Calcium) .... Take 1 Tablet By Mouth Once A Day 13)  Contour Blood Glucose System W/device Kit (Blood Glucose Monitoring Suppl) .... Use Two Times A Day As Directed 14)  Levemir Flexpen 100 Unit/ml Soln (Insulin Detemir) .... 50 Units Once Daily 15)  Glimepiride 4 Mg Tabs (Glimepiride) .... One By Mouth Once Daily For  Diabetes 16)  Tramadol Hcl 50 Mg Tabs (Tramadol Hcl) .... One By Mouth Qid As Needed For Pain 17)  Bactroban 2 % Oint (Mupirocin) .... Apply To Infection On  Right Leg Two Times A Day For 10 Days 18)  Pristiq 50 Mg Xr24h-Tab (Desvenlafaxine Succinate) .... One By Mouth Once Daily 19)  Celebrex 200 Mg Caps (Celecoxib) .... One By Mouth Once Daily As Needed For Foot Pain  Allergies (verified): 1)  ! Metformin Hcl  Past History:  Past Medical History: Last updated: 10/02/2010 1. Congestive heart failure secondary to ischemic cardiomyopathy          a. ECHO 12/09: EF 45-50% (previously 20%)          b. ECHO 4/11: EF 40%  2. 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 s/p stent LAD 3. Morbid obesity 4. Diabetes.  5. Hyperlipidemia 6. GERD  Past Surgical History: Last updated: 06/27/2010 PTCA/stent  Family History: Last updated: 10/07/2008 Both of his parents are alive.  His mother is currently   age 63.  She is status post CABG at age 49.  She also has a history of   hyperlipidemia.  Father has a history of hypertension, diabetes as well   as VT and is status post ICD placement.  Denies any prior history of   coronary disease.  He has one brother and sister, both are alive and   well.   Social History: Last updated: 10/07/2008 Single  Tobacco Use - Yes.  Alcohol Use - no Regular Exercise - no Drug Use - yes  Risk Factors: Alcohol Use: 0 (10/11/2010)  Risk Factors: Smoking Status: quit < 6 months (10/11/2010)  Family History: Reviewed history from 10/07/2008 and no changes required. Both of his parents are alive.  His mother is currently   age 76.  She is status post CABG at age 22.  She also has a history of   hyperlipidemia.  Father has a history of hypertension, diabetes as well   as VT and is status post ICD placement.  Denies any prior history of   coronary disease.  He has one brother and sister, both are alive and   well.   Social History: Reviewed  history from 10/07/2008 and no changes required. Single  Tobacco Use - Yes.  Alcohol Use - no Regular Exercise - no Drug Use - yes  Review of Systems  The patient denies anorexia, fever, weight loss, weight gain, chest pain, syncope, dyspnea on exertion, peripheral edema, prolonged cough, headaches, hemoptysis, abdominal pain, hematuria, suspicious skin lesions, enlarged lymph nodes, and angioedema.   MS:  Complains of joint pain and stiffness; denies joint redness, joint swelling, loss of strength, low back pain, mid back pain, and muscle aches. Psych:  Denies alternate hallucination ( auditory/visual), anxiety, depression, easily angered, easily tearful, irritability, mental problems, panic attacks, sense of great danger, suicidal thoughts/plans, and thoughts of violence. Endo:  Denies cold intolerance, excessive hunger, excessive thirst, excessive urination, heat intolerance, polyuria, and weight change.  Physical Exam  General:  alert, well-developed, well-nourished, well-hydrated, appropriate dress, normal appearance, healthy-appearing, cooperative to examination, good hygiene, and overweight-appearing.   Head:  normocephalic, atraumatic, no abnormalities observed, and no abnormalities palpated.   Mouth:  Oral mucosa and oropharynx without lesions or exudates.  Teeth in good repair. Neck:  supple, full ROM, no masses, no thyromegaly, no thyroid nodules or tenderness, no JVD, normal carotid upstroke, no carotid bruits, no cervical lymphadenopathy, and no neck tenderness.   Lungs:  normal respiratory effort, no intercostal retractions, no accessory muscle use, normal breath sounds, no dullness, no fremitus, no crackles, and no wheezes.  Heart:  normal rate, regular rhythm, no murmur, no gallop, no rub, and no JVD.   Abdomen:  soft, non-tender, normal bowel sounds, no distention, no masses, no guarding, no rigidity, and no rebound tenderness.   Msk:  normal ROM, no joint tenderness, no  joint swelling, no joint warmth, no redness over joints, no joint deformities, no joint instability, no crepitation, and no muscle atrophy.   Pulses:  R and L carotid,radial,femoral,dorsalis pedis and posterior tibial pulses are full and equal bilaterally Extremities:  trace left pedal edema and trace right pedal edema.   Neurologic:  No cranial nerve deficits noted. Station and gait are normal. Plantar reflexes are down-going bilaterally. DTRs are symmetrical throughout. Sensory, motor and coordinative functions appear intact. Skin:  over his right lateral calf area there is a round 2cm confluent area of PIPA with a small scab  but there is no  exudate, fluctuance, induration, streaking, warmth, ttp, vesicles, erythema, warmth, or scale. Cervical Nodes:  no anterior cervical adenopathy and no posterior cervical adenopathy.   Axillary Nodes:  no R axillary adenopathy and no L axillary adenopathy.   Psych:  Oriented X3, memory intact for recent and remote, normally interactive, good eye contact, not anxious appearing, not depressed appearing, not agitated, and not suicidal.     Impression & Recommendations:  Problem # 1:  DIABETES MELLITUS, TYPE II (ICD-250.00) Assessment Deteriorated  The following medications were removed from the medication list:    Levemir Flexpen 100 Unit/ml Soln (Insulin detemir) ..... Use as directed His updated medication list for this problem includes:    Altace 10 Mg Caps (Ramipril) .Marland Kitchen... 1 by mouth once daily    Aspirin Ec 325 Mg Tbec (Aspirin) .Marland Kitchen... Take one tablet by mouth daily    Levemir Flexpen 100 Unit/ml Soln (Insulin detemir) .Marland KitchenMarland KitchenMarland KitchenMarland Kitchen 50 units once daily    Glimepiride 4 Mg Tabs (Glimepiride) ..... 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 One by mouth once daily  Problem # 5:  DEGENERATIVE JOINT DISEASE (ICD-715.90) Assessment: New  His updated medication list for this problem includes:    Aspirin Ec 325 Mg Tbec (Aspirin) .Marland Kitchen... Take one tablet by mouth daily    Tramadol Hcl 50 Mg Tabs (Tramadol hcl) ..... One by mouth qid as needed for pain    Celebrex 200 Mg Caps (Celecoxib) ..... One by mouth once daily as needed for foot pain  Discussed use of medications, application of heat or cold, and exercises.   Complete Medication List: 1)  Carvedilol 25 Mg Tabs (Carvedilol) .... Take two tablets by mouth twice a day 2)  Furosemide 40 Mg Tabs (Furosemide) .Marland Kitchen.. 1 tab by mouth two times a day 3)  Altace 10 Mg Caps (Ramipril) .Marland Kitchen.. 1 by mouth once daily 4)  Digoxin 0.25 Mg Tabs (Digoxin) .Marland Kitchen.. 1 tab by mouth once daily 5)  Klor-con M20 20 Meq Cr-tabs (Potassium chloride crys cr) .Marland Kitchen.. 1 tab by mouth once daily 6)  Aspirin Ec 325 Mg Tbec (Aspirin) .... Take one tablet  by mouth daily 7)  Spironolactone 25 Mg Tabs (Spironolactone) .... Take 1/2 tablet by mouth two times a day 8)  Plavix 75 Mg Tabs (Clopidogrel bisulfate) .... Take one tablet by mouth daily 9)  Fish Oil Oil (Fish oil) .... 4 tabs daily 10)  Allegra 180 Mg Tabs (Fexofenadine hcl) .... As needed 11)  Prevacid 15 Mg Cpdr (Lansoprazole) .... Once daily as needed 12)  Atorvastatin Calcium 80 Mg Tabs (Atorvastatin calcium) .... Take 1 tablet by mouth once a day 13)  Contour Blood Glucose System W/device Kit (Blood glucose monitoring suppl) .... Use two times a day as directed 14)  Levemir Flexpen 100 Unit/ml Soln (Insulin detemir) .... 50 units once  daily 15)  Glimepiride 4 Mg Tabs (Glimepiride) .... One by mouth once daily for diabetes 16)  Tramadol Hcl 50 Mg Tabs (Tramadol hcl) .... One by mouth qid as needed for pain 17)  Bactroban 2 % Oint (Mupirocin) .... Apply to infection on right leg two times a day for 10 days 18)  Pristiq 50 Mg Xr24h-tab (Desvenlafaxine succinate) .... One by mouth once daily 19)  Celebrex 200 Mg Caps (Celecoxib) .... One by mouth once daily as needed for foot pain  Patient Instructions: 1)  Please schedule a follow-up appointment in 2 months. 2)  It is important that you exercise regularly at least 20 minutes 5 times a week. If you develop chest pain, have severe difficulty breathing, or feel very tired , stop exercising immediately and seek medical attention. 3)  You need to lose weight. Consider a lower calorie diet and regular exercise.  4)  Check your blood sugars regularly. If your readings are usually above 200 or below 70 you should contact our office. 5)  It is important that your Diabetic A1c level is checked every 3 months. 6)  See your eye doctor yearly to check for diabetic eye damage. 7)  Check your feet each night for sore areas, calluses or signs of infection. 8)  Check your Blood Pressure regularly. If it is above 130/80: you should make an appointment. 9)  Take 650-1000mg  of Tylenol every 4-6 hours as needed for relief of pain or comfort of fever AVOID taking more than 4000mg   in a 24 hour period (can cause liver damage in higher doses). Prescriptions: CELEBREX 200 MG CAPS (CELECOXIB) One by mouth once daily as needed for foot pain  #15 x 0   Entered and Authorized by:   Etta Grandchild MD   Signed by:   Etta Grandchild MD on 10/11/2010   Method used:   Samples Given   RxID:   [id number]    Orders Added: 1)  Podiatry Referral [Podiatry] 2)  Est. Patient Level IV [24401]

## 2010-10-25 ENCOUNTER — Telehealth: Payer: Self-pay | Admitting: Internal Medicine

## 2010-10-30 NOTE — Progress Notes (Signed)
Summary: plavix and lipitor  Phone Note Outgoing Call   Call placed by: Meredith Staggers, RN,  October 25, 2010 5:56 PM Summary of Call: pts meds arrived from companies (plavix and lipitor) left mess w/pts mom that meds left at front desk

## 2010-12-13 ENCOUNTER — Encounter: Payer: Self-pay | Admitting: Internal Medicine

## 2010-12-13 ENCOUNTER — Ambulatory Visit (INDEPENDENT_AMBULATORY_CARE_PROVIDER_SITE_OTHER): Payer: Medicare Other | Admitting: Internal Medicine

## 2010-12-13 ENCOUNTER — Other Ambulatory Visit (INDEPENDENT_AMBULATORY_CARE_PROVIDER_SITE_OTHER): Payer: Medicare Other

## 2010-12-13 ENCOUNTER — Other Ambulatory Visit (INDEPENDENT_AMBULATORY_CARE_PROVIDER_SITE_OTHER): Payer: Medicare Other | Admitting: Internal Medicine

## 2010-12-13 DIAGNOSIS — I10 Essential (primary) hypertension: Secondary | ICD-10-CM

## 2010-12-13 DIAGNOSIS — F329 Major depressive disorder, single episode, unspecified: Secondary | ICD-10-CM

## 2010-12-13 DIAGNOSIS — M79609 Pain in unspecified limb: Secondary | ICD-10-CM

## 2010-12-13 DIAGNOSIS — I120 Hypertensive chronic kidney disease with stage 5 chronic kidney disease or end stage renal disease: Secondary | ICD-10-CM

## 2010-12-13 DIAGNOSIS — E119 Type 2 diabetes mellitus without complications: Secondary | ICD-10-CM

## 2010-12-13 DIAGNOSIS — F3289 Other specified depressive episodes: Secondary | ICD-10-CM

## 2010-12-13 DIAGNOSIS — E785 Hyperlipidemia, unspecified: Secondary | ICD-10-CM

## 2010-12-13 DIAGNOSIS — I5022 Chronic systolic (congestive) heart failure: Secondary | ICD-10-CM

## 2010-12-13 DIAGNOSIS — K219 Gastro-esophageal reflux disease without esophagitis: Secondary | ICD-10-CM

## 2010-12-13 DIAGNOSIS — A630 Anogenital (venereal) warts: Secondary | ICD-10-CM

## 2010-12-13 DIAGNOSIS — M1990 Unspecified osteoarthritis, unspecified site: Secondary | ICD-10-CM

## 2010-12-13 LAB — COMPREHENSIVE METABOLIC PANEL
ALT: 23 U/L (ref 0–53)
AST: 20 U/L (ref 0–37)
Albumin: 3.8 g/dL (ref 3.5–5.2)
BUN: 11 mg/dL (ref 6–23)
CO2: 27 mEq/L (ref 19–32)
Calcium: 9.7 mg/dL (ref 8.4–10.5)
Chloride: 98 mEq/L (ref 96–112)
GFR: 134.76 mL/min (ref >60.00)
Potassium: 4.3 mEq/L (ref 3.5–5.1)

## 2010-12-13 LAB — CBC WITH DIFFERENTIAL/PLATELET
Basophils Absolute: 0 10*3/uL (ref 0.0–0.1)
Lymphocytes Relative: 24.8 % (ref 12.0–46.0)
Monocytes Relative: 7 % (ref 3.0–12.0)
Platelets: 206 10*3/uL (ref 150.0–400.0)
RDW: 12.6 % (ref 11.5–14.6)

## 2010-12-13 LAB — LIPID PANEL
Cholesterol: 190 mg/dL (ref 0–200)
Total CHOL/HDL Ratio: 6
Triglycerides: 541 mg/dL — ABNORMAL HIGH (ref 0.0–149.0)
VLDL: 108.2 mg/dL — ABNORMAL HIGH (ref 0.0–40.0)

## 2010-12-13 LAB — HEMOGLOBIN A1C: Hgb A1c MFr Bld: 11.4 % — ABNORMAL HIGH (ref 4.6–6.5)

## 2010-12-13 LAB — C-PEPTIDE: C-Peptide: 4.13 ng/mL — ABNORMAL HIGH (ref 0.80–3.90)

## 2010-12-13 MED ORDER — ESOMEPRAZOLE MAGNESIUM 40 MG PO CPDR: 40.0000 mg | DELAYED_RELEASE_CAPSULE | Freq: Every day | ORAL | Status: DC

## 2010-12-13 MED ORDER — COLESEVELAM HCL 625 MG PO TABS: 1875.0000 mg | ORAL_TABLET | Freq: Two times a day (BID) | ORAL | Status: DC

## 2010-12-13 MED ORDER — OMEGA-3-ACID ETHYL ESTERS 1 G PO CAPS: 2.0000 g | ORAL_CAPSULE | Freq: Two times a day (BID) | ORAL | Status: DC

## 2010-12-13 MED ORDER — HYDROCODONE-ACETAMINOPHEN 7.5-500 MG PO TABS: 1.0000 | ORAL_TABLET | Freq: Four times a day (QID) | ORAL | Status: AC | PRN

## 2010-12-13 NOTE — Progress Notes (Signed)
Subjective:    Patient ID: DECKER COGDELL, male    DOB: 12/01/1975, 35 y.o.   MRN: [id number]  Heartburn He complains of belching and heartburn. He reports no abdominal pain, no chest pain, no choking, no coughing, no dysphagia, no early satiety, no globus sensation, no hoarse voice, no nausea, no sore throat, no stridor, no tooth decay, no water brash or no wheezing. This is a chronic problem. The current episode started more than 1 year ago. The problem occurs frequently. The problem has been unchanged. The heartburn duration is more than one hour. The heartburn is located in the substernum. The heartburn is of mild intensity. The heartburn does not wake him from sleep. The heartburn does not limit his activity. The heartburn doesn't change with position. The symptoms are aggravated by nothing. Pertinent negatives include no anemia, fatigue, melena, muscle weakness, orthopnea or weight loss. Risk factors include smoking/tobacco exposure. He has tried a PPI for the symptoms. The treatment provided mild relief.  Diabetes He presents for his follow-up diabetic visit. He has type 2 diabetes mellitus. No MedicAlert identification noted. His disease course has been worsening. There are no hypoglycemic associated symptoms. Pertinent negatives for hypoglycemia include no confusion, dizziness, headaches, nervousness/anxiousness, pallor, seizures, speech difficulty or tremors. Pertinent negatives for diabetes include no blurred vision, no chest pain, no fatigue, no foot paresthesias, no foot ulcerations, no polydipsia, no polyphagia, no polyuria, no visual change, no weakness and no weight loss. There are no hypoglycemic complications. Symptoms are worsening. There are no diabetic complications. Current diabetic treatment includes intensive insulin program and oral agent (monotherapy). He is compliant with treatment some of the time. His weight is stable. He is following a generally healthy diet. Meal planning  includes avoidance of concentrated sweets. He has not had a previous visit with a dietician. He never participates in exercise. His home blood glucose trend is increasing steadily. His breakfast blood glucose range is generally 110-130 mg/dl. His lunch blood glucose range is generally 140-180 mg/dl. His dinner blood glucose range is generally 140-180 mg/dl. His highest blood glucose is >200 mg/dl. His overall blood glucose range is 130-140 mg/dl. An ACE inhibitor/angiotensin II receptor blocker is being taken. He sees a podiatrist.Eye exam is not current.  Congestive Heart Failure Presents for follow-up visit. Pertinent negatives include no abdominal pain, chest pain, chest pressure, claudication, edema, fatigue, muscle weakness, near-syncope, nocturia, orthopnea, palpitations, paroxysmal nocturnal dyspnea, shortness of breath or unexpected weight change. The symptoms have been resolved. There is no history of anemia. Compliance with total regimen is 51-75%. Compliance problems include medication cost.  Compliance with diet is 51-75%. Compliance with exercise is 0-25%. Compliance with medications is 51-75%.      Review of Systems  Constitutional: Negative for fever, weight loss, diaphoresis, activity change, appetite change, fatigue and unexpected weight change.  HENT: Negative for nosebleeds, sore throat, hoarse voice, facial swelling, trouble swallowing, neck pain, neck stiffness and voice change.   Eyes: Negative for blurred vision.  Respiratory: Negative for apnea, cough, choking, chest tightness, shortness of breath, wheezing and stridor.   Cardiovascular: Negative for chest pain, palpitations, claudication, leg swelling and near-syncope.  Gastrointestinal: Positive for heartburn. Negative for dysphagia, nausea, vomiting, abdominal pain, diarrhea, constipation, blood in stool, melena, abdominal distention, anal bleeding and rectal pain.  Genitourinary: Negative for dysuria, urgency, polyuria,  frequency, hematuria, decreased urine volume, difficulty urinating and nocturia.  Musculoskeletal: Positive for arthralgias (bilateral foot pain, saw podiatrist 3 weeks ago). Negative for myalgias, back pain,  joint swelling, gait problem and muscle weakness.  Skin: Negative for color change, pallor and rash.  Neurological: Negative for dizziness, tremors, seizures, syncope, facial asymmetry, speech difficulty, weakness, light-headedness, numbness and headaches.  Hematological: Negative for polydipsia, polyphagia and adenopathy. Does not bruise/bleed easily.  Psychiatric/Behavioral: Negative for hallucinations, behavioral problems, confusion, self-injury, dysphoric mood, decreased concentration and agitation. The patient is not nervous/anxious and is not hyperactive.         Objective:   Physical Exam  Vitals reviewed. Constitutional: He is oriented to person, place, and time. He appears well-developed and well-nourished. No distress.  HENT:  Head: Normocephalic and atraumatic.  Right Ear: External ear normal.  Left Ear: External ear normal.  Nose: Nose normal.  Mouth/Throat: Oropharynx is clear and moist. No oropharyngeal exudate.  Eyes: Conjunctivae and EOM are normal. Pupils are equal, round, and reactive to light. Right eye exhibits no discharge. Left eye exhibits no discharge. No scleral icterus.  Neck: Normal range of motion. Neck supple. No JVD present. No tracheal deviation present. No thyromegaly present.  Cardiovascular: Normal rate, regular rhythm, normal heart sounds and intact distal pulses.  Exam reveals no gallop and no friction rub.   No murmur heard. Pulmonary/Chest: Effort normal and breath sounds normal. No respiratory distress. He has no wheezes. He has no rales. He exhibits no tenderness.  Abdominal: Soft. Bowel sounds are normal. He exhibits no distension and no mass. There is no tenderness. There is no rebound and no guarding.  Musculoskeletal: Normal range of motion.  He exhibits no edema and no tenderness.  Lymphadenopathy:    He has no cervical adenopathy.  Neurological: He is alert and oriented to person, place, and time. He has normal reflexes. He displays normal reflexes. No cranial nerve deficit. He exhibits normal muscle tone. Coordination normal.  Skin: Skin is warm and dry. No rash noted. He is not diaphoretic. No erythema. No pallor.  Psychiatric: He has a normal mood and affect. His behavior is normal. Judgment and thought content normal.        Lab Results  Component Value Date   WBC 7.5 06/27/2010   HGB 17.9* 06/27/2010   HCT 49.9 06/27/2010   PLT 214.0 06/27/2010   CHOL 226* 08/23/2010   TRIG 238.0* 08/23/2010   HDL 31.00* 08/23/2010   LDLDIRECT 165.5 08/23/2010   ALT 26 06/27/2010   AST 18 06/27/2010   NA 136 08/23/2010   K 4.3 08/23/2010   CL 101 08/23/2010   CREATININE 0.7 08/23/2010   BUN 9 08/23/2010   CO2 26 08/23/2010   TSH 0.58 06/27/2010   INR 1.0 RATIO 11/12/2007   HGBA1C 10.9* 08/23/2010    Assessment & Plan:

## 2010-12-13 NOTE — Assessment & Plan Note (Signed)
He has not received relief with celebrex, tramadol, or injections by a podiatrist so I will try an opiate.

## 2010-12-13 NOTE — Assessment & Plan Note (Signed)
He is well compensated today

## 2010-12-13 NOTE — Assessment & Plan Note (Signed)
Start nexium and check CBC

## 2010-12-13 NOTE — Assessment & Plan Note (Addendum)
Add on welchol, check A1C and renal function today, will also check a c-peptide to try to distinguish DM I from II

## 2010-12-13 NOTE — Assessment & Plan Note (Signed)
BP is well controlled, I will check his lytes and renal function today 

## 2010-12-13 NOTE — Assessment & Plan Note (Signed)
He is doing well on pristiq

## 2010-12-13 NOTE — Assessment & Plan Note (Signed)
Start lortab

## 2010-12-13 NOTE — Patient Instructions (Signed)
Diabetes, Type 2 Diabetes is a lasting (chronic) disease. In type 2 diabetes, the pancreas does not make enough insulin (a hormone), and the body does not respond normally to the insulin that is made. This type of diabetes was also previously called adult onset diabetes. About 90% of all those who have diabetes have type 2. It usually occurs after the age of 7 but can occur at any age. CAUSES Unlike type 1 diabetes, which happens because insulin is no longer being made, type 2 diabetes happens because the body is making less insulin and has trouble using the insulin properly. SYMPTOMS  Drinking more than usual.   Urinating more than usual.   Blurred vision.   Dry, itchy skin.   Frequent infection like yeast infections in women.   More tired than usual (fatigue).  TREATMENT  Healthy eating.   Exercise.   Medication, if needed.   Monitoring blood glucose (sugar).   Seeing your caregiver regularly.  HOME CARE INSTRUCTIONS  Check your blood glucose (sugar) at least once daily. More frequent monitoring may be necessary, depending on your medications and on how well your diabetes is controlled. Your caregiver will advise you.   Take your medicine as directed by your caregiver.   Do not smoke.   Make wise food choices. Ask your caregiver for information. Weight loss can improve your diabetes.   Learn about low blood glucose (hypoglycemia) and how to treat it.   Get your eyes checked regularly.   Have a yearly physical exam. Have your blood pressure checked. Get your blood and urine tested.   Wear a pendant or bracelet saying that you have diabetes.   Check your feet every night for sores. Let your caregiver know if you have sores that are not healing.  SEEK MEDICAL CARE IF:  You are having problems keeping your blood glucose at target range.   You feel you might be having problems with your medicines.   You have symptoms of an illness that is not improving after 24  hours.   You have a sore or wound that is not healing.   You notice a change in vision or a new problem with your vision.   You develop a fever of more than 100.5.  Document Released: 08/05/2005 Document Re-Released: 08/27/2009 [facility] Patient Information 2011 [place], [place].Heartburn Heartburn is a painful, burning sensation in the chest. It may feel worse in certain positions, such as lying down or bending over. It is caused by stomach acid backing up into the tube that carries food from the mouth down to the stomach (lower esophagus).  CAUSES A number of conditions can cause or worsen heartburn, including:   Pregnancy.   Being overweight (obesity).   A condition called hiatal hernia, in which part or all of the stomach is moved up into the chest through a weakness in the diaphragm muscle.   Alcohol.   Exercise.   Eating just before going to bed.   Overeating.   Medications, including:   Nonsteroidal anti-inflammatory drugs, such as ibuprofen and naproxen.   Aspirin.   Some blood pressure medicines, including beta-blockers, calcium channel blockers, and alpha-blockers.   Nitrates (used to treat angina).   The asthma medication Theophylline.   Certain sedative drugs.   Heartburn may be worse after eating certain foods. These heartburn-causing foods are different for different people, but may include:   Peppers.   Chocolate.   Coffee.   High-fat foods, including fried foods.   Spicy foods.  Garlic, onions.   Citrus fruits, including oranges, grapefruit, lemons and limes.   Food containing tomatoes or tomato products.   Mint.   Carbonated beverages.   Vinegar.  SYMPTOMS  Symptoms may last for a few minutes or a few hours, and can include:  Burning pain in the chest or lower throat.   Bitter taste in the mouth.   Coughing.  DIAGNOSIS If the usual treatments for heartburn do not improve your symptoms, then tests may be done to see if there is  another condition present. Possible tests may include:  X-rays.   Endoscopy. This is when a tube with a light and a camera on the end is used to examine the esophagus and the stomach.   Blood, breath, or stool tests may be used to check for bacteria that cause ulcers.  TREATMENT There are a number of non-prescription medicines used to treat heartburn, including:  Antacids.   Acid reducers (also called H-2 blockers).   Proton-pump inhibitors.  HOME CARE INSTRUCTIONS  Raise the head of your bed by putting blocks under the legs.   Eat 2-3 hours before going to bed.   Stop smoking.   Try to reach and maintain a healthy weight.   Do not eat just a few very large meals. Instead, eat many smaller meals throughout the day.   Try to identify foods and beverages that make your symptoms worse, and avoid these.   Avoid tight clothing.   Do not exercise right after eating.  SEEK IMMEDIATE MEDICAL CARE IF YOU:  Have severe chest pain that goes down your arm, or into your jaw or neck.   Feel sweaty, dizzy, or lightheaded.   Are short of breath.   Throw up (vomit) blood.   Have difficulty or pain with swallowing.   Have bloody or black, tarry stools.   Have bouts of heartburn more than three times a week for more than two weeks.  Document Released: 12/22/2008 Document Re-Released: 10/30/2009 [facility] Patient Information 2011 [place], [place].

## 2010-12-13 NOTE — Assessment & Plan Note (Addendum)
Add on lovaza and welchol, check FLP and LFT's today

## 2010-12-14 ENCOUNTER — Encounter: Payer: Self-pay | Admitting: Internal Medicine

## 2010-12-14 MED ORDER — PIOGLITAZONE HCL 30 MG PO TABS: 30.0000 mg | ORAL_TABLET | Freq: Every day | ORAL | Status: DC

## 2010-12-14 MED ORDER — LINAGLIPTIN 5 MG PO TABS: 1.0000 | ORAL_TABLET | Freq: Every day | ORAL | Status: DC

## 2010-12-14 NOTE — Progress Notes (Signed)
Addended by: Sanda Linger on: 12/14/2010 07:51 AM   Modules accepted: Orders

## 2011-01-01 NOTE — Assessment & Plan Note (Signed)
[facility]                            CARDIOLOGY OFFICE NOTE   Foley, [name]                      MRN:          [id number]  DATE:07/18/2008                            DOB:          [birth year]    PRIMARY CARE PHYSICIAN:  [name] Savers, MD   INTERVAL HISTORY:  [name] Foley is a 35 year old male with a history of  morbid obesity, hypertension, diabetes, and congestive heart failure  secondary to severe ischemic cardiomyopathy with ejection fraction of  20%.  Cardiac catheterization earlier this year showed severe three-  vessel coronary artery disease which was not amenable to bypass surgery  to the distal disease.  He did undergo angioplasty of a portion of his  LAD.  He returns for followup.   Overall, he is doing better.  He denies any chest pain.  His shortness  of breath is much decreased.  He is walking his dog every day and  feeling better.  He does not have any lower extremity edema.  No  orthopnea, no PND.  He has been compliant with all his medications.   CURRENT MEDICATIONS:  1. Altace 10 a day.  2. Metformin 1000 b.i.d.  3. Digoxin 0.25 a day.  4. Potassium 20 a day.  5. Zocor 80 a day.  6. Aspirin 325 a day.  7. Spironolactone 12.5 b.i.d.  8. Lasix 40 b.i.d.  9. Plavix 75 a day.  10.TriCor 145 a day.  11.Coreg 25 b.i.d.   PHYSICAL EXAMINATION:  GENERAL:  He is in no acute distress, ambulates  around the clinic without any respiratory difficulty.  VITAL SIGNS:  Blood pressure is 134/76, heart rate is 67, weight is 335.  HEENT:  Normal.  NECK:  Supple.  No obvious JVD.  Carotids are 2+ bilaterally without any  bruits.  There is no lymphadenopathy or thyromegaly.  CARDIAC:  PMI is not palpable.  He is regular with no murmurs, rubs, or  gallops.  LUNGS:  Clear.  ABDOMEN:  Obese, nontender, nondistended.  No hepatosplenomegaly, no  bruits, no masses.  Good bowel sounds.  EXTREMITIES:  Warm with no cyanosis, clubbing, or  edema.  No rash.  NEUROLOGIC:  Alert and oriented x3.  Cranial nerves II through XII are  intact.  Moves all 4 extremities without difficulty.  Affect is  pleasant.   ASSESSMENT AND PLAN:  1. Congestive heart failure secondary to ischemic cardiomyopathy.  He      is doing well.  He is New York Heart Association functional class      II.  Volume status looks good.  We will titrate Coreg to 37.5      b.i.d.  He will need a repeat echocardiogram to reassess his      ejection fraction.  2. Coronary artery disease, this is stable.  Continue current therapy.  3. Obesity.  I encouraged him to continue to lose weight.  4. Diabetes.  This is followed by Dr. Amador Cunas.  Unfortunately, he      has not seen him in some time and I have encouraged him to go back  for followup.  5. Hyperlipidemia.  We will check his fasting lipids this week.  Goal      LDL is less than 70.     Bevelyn Buckles. Bensimhon, MD  Electronically Signed    DRB/MedQ  DD: 07/18/2008  DT: 07/19/2008  Job #: [id number]   cc:   [name] Savers, MD

## 2011-01-01 NOTE — Cardiovascular Report (Signed)
NAME:  [name] Foley, [name] Foley               ACCOUNT NO.:  [id number]   MEDICAL RECORD NO.:  [id number]          PATIENT TYPE:  INP   LOCATION:  6529                         FACILITY:  [facility]   PHYSICIAN:  Cleburn Maiolo. Excell Seltzer, MD  DATE OF BIRTH:  1976/03/31   DATE OF PROCEDURE:  DATE OF DISCHARGE:                            CARDIAC CATHETERIZATION   PROCEDURE:  PTCA and stenting of the proximal LAD.   INDICATIONS:  [name] is a very nice 35 year old gentleman with diabetes  and severe ischemic cardiomyopathy.  He underwent diagnostic  catheterization by Dr. [name] that demonstrated severe multivessel  disease with total occlusions of all of his major distal branch vessels.  His LVEF is 20%.  He was evaluated for bypass surgery, but was not felt  to be a candidate due to poor distal targets.  He was referred for PCI  of a severe stenosis in the proximal LAD.  The proximal LAD is really  his only reasonable area to revascularize as the other vessels are small  and severely diseased involving mostly total occlusions in the distal  vascular beds.   Risks and indications of the procedure were reviewed with the patient.  Informed consent was obtained.  The right groin was prepped, draped,  anesthetized with 1% lidocaine using modified Seldinger technique.  A 6-  French sheath was placed in the right femoral artery.  Angiomax was used  for anticoagulation.  The patient had been pretreated with clopidogrel.  A 6-French XB 3.5 cm guide catheter was inserted.  Initial views showed  severe 95% stenosis of the proximal LAD.  Once therapeutic ACT was  achieved, a Cougar guidewire was passed beyond the area of severe  stenosis without difficulty.  The lesion was predilated with a 2.5 x 12  mm Maverick balloon up to 8 atmospheres.  Intracoronary nitroglycerin  was given.  The vessel appeared to be approximately 3 mm in diameter.  A  3.0 x 18 mm Promus stent was carefully positioned and then deployed at  14  atmospheres.  The stent was well expanded and appeared well sized for  the vessel.  There was TIMI III flow after stenting.  There was mild  residual stenosis in the midportion of the stent.  I elected to post-  dilate with 3.25 x 15 mm Quantum Maverick balloon which was taken to 16  atmospheres over the distal stent and 18 atmospheres over the proximal  stent.  At completion of the procedure, there was a widely patent stent  with TIMI III flow throughout.  Mr. Brager tolerated the procedure well  without any difficulty.  There were no immediate complications.  He was  transferred to the recovery area in stable condition.   CONCLUSION:  Successful stenting of the proximal left anterior  descending with a Promus drug-eluting stent.   PLAN:  I recommend a minimum of 12 months of dual antiplatelet therapy  with aspirin and Plavix.  If he tolerates Plavix well, I would favor  long-term treatment.      Veverly Fells. Excell Seltzer, MD  Electronically Signed     MDC/MEDQ  D:  11/19/2007  T:  11/19/2007  Job:  [id number]   cc:   Bevelyn Buckles. Bensimhon, MD  [name], MD

## 2011-01-01 NOTE — Assessment & Plan Note (Signed)
[facility]                            CARDIOLOGY OFFICE NOTE   DEONDRICK, SEARLS                      MRN:          [id number]  DATE:11/12/2007                            DOB:          [birth year]    PRIMARY CARE PHYSICIAN:  [name], MD.   INTERVAL HISTORY:  [name] is a 35 year old male with a history of morbid  obesity, hypertension, diabetes as well as congestive heart failure  secondary to severe ischemic cardiomyopathy, ejection fraction  approximately 20%.   He returns today for routine follow-up.  I reviewed his catheterization  films with Dr. Excell Seltzer as well as Dr. Versie Starks at [facility] in the  transplant clinic.  He has severe three-vessel disease with diabetic  vasculopathy.  I have also presented the case to Dr. Laneta Simmers in  cardiothoracic surgery and he is not a candidate for bypass surgery due  to poor distal targets.  We had him scheduled for angioplasty of his LAD  last week however, he missed it as he has been quite sick with a sinus  infection which he is just now recovering from.   He continues to feel short of breath and fatigued, occasional chest pain  but this is really only when coughing.  He denies any orthopnea, PND or  lower extremity edema.  His blood sugars have been running in the low  170 to 180 range.   CURRENT MEDICATIONS:  1. Altace 10 a day.  2. Metformin 1000 b.i.d.  3. Digoxin 0.25 mg a day.  4. Potassium 20 a day.  5. Zocor 80 a day.  6. Aspirin 325 a day.  7. Coreg 12.5 b.i.d.  8. Lasix 40 b.i.d.  9. Spirolactone 12.5 b.i.d.   PHYSICAL EXAM:  He is congested but otherwise in no acute distress. He  ambulates around the clinic without any respiratory difficulty.  Blood pressure is 126/96, heart rate 78, weight is 351.  HEENT:  Normal.  NECK:  Supple.  No obvious JVD.  Carotids are  2+ bilaterally without  bruits.  There is no lymphadenopathy or thyromegaly.  CARDIAC:  PMI is not palpable.  He  is regular with no murmurs, rubs or  gallops.  LUNGS:  Clear.  ABDOMEN:  Obese, nontender, nondistended, unable to appreciate any  hepatosplenomegaly, no bruits, no mass.  EXTREMITIES:  Warm with no cyanosis, clubbing or edema.  No rash.  NEURO:  Alert and oriented x3.  Cranial nerves II-XII are intact.  He  moves all four extremities without difficulty.  Affect is pleasant.   EKG shows normal sinus rhythm at a rate of 78. No significant ST-T wave  abnormalities.  There is a non specific intraventricular conduction  delay at 116 milliseconds.   ASSESSMENT/PLAN:  1. Congestive heart failure secondary to an ischemic cardiomyopathy.      He NYHA class 3 symptoms.  Given the fact that he feels quite      poorly, this week we will not adjust any medications.  We will      reschedule him for his angioplasty next week.  He will need  to be      aggressive in trying to lose weight and control his diabetes.  2. Hypertension. Blood pressure is mildly elevated. I will try to      titrate his Coreg at next visit.  3. Hyperlipidemia.  Continue Zocor.  Goal LDL is less than 70.  4. Diabetes. 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     Bevelyn Buckles. Bensimhon, MD  Electronically Signed    DRB/MedQ  DD: 11/12/2007  DT: 11/13/2007  Job #: [id number]   cc:   [name], MD

## 2011-01-01 NOTE — Discharge Summary (Signed)
NAME:  [name] Foley, [name] Foley               ACCOUNT NO.:  [id number]   MEDICAL RECORD NO.:  [id number]          PATIENT TYPE:  INP   LOCATION:  4707                         FACILITY:  [facility]   PHYSICIAN:  Valetta Mole. Swords, MD    DATE OF BIRTH:  1976-08-12   DATE OF ADMISSION:  09/03/2007  DATE OF DISCHARGE:  09/05/2007                               DISCHARGE SUMMARY   DISCHARGE DIAGNOSES:  1. Nonischemic cardiomyopathy.  2. Morbid obesity.  3. Diabetes.  4. Abdominal discomfort.  5. Elevated troponin I.  6. Left ventricular dysfunction.  7. Hypertension.   DISCHARGE MEDICATIONS:  1. Coreg 3.125 mg p.o. b.i.d.  2. Furosemide 20 mg p.o. daily.  3. Altace 10 mg p.o. daily.  4. Metformin 1000 mg p.o. b.i.d.   HOSPITAL PROCEDURES:  1. Echocardiogram demonstrates decreased LV function.  2. CT angio of the abdomen and chest were essentially unremarkable.   CONSULTATIONS:  Cardiology.   DISCHARGE LABORATORIES:  Lipase and amylase normal.  CBC normal.  TSH  normal.  Hemoglobin A1C markedly elevated at 8.8%.  Cardiac enzymes, CK  and CK-MB normal.  Troponin I is elevated at 0.21.  Sedimentation rate  normal at 2.  BNP mildly elevated at 362.  D-dimer elevated at 1.05.   HOSPITAL COURSE:  Patient admitted to the hospital on September 03, 2007.  See Dr. Chancy Milroy note for details.  Patient admitted with abdominal  discomfort with mildly elevated cardiac enzymes.  Cardiology  consultation was performed.  A 2D echo was performed with results as  above.  Patient will be discharged on medications as listed above.  Consideration will be given to noninvasive imaging versus cardiac cath.  It is worth noting that given the patient's body habitus, the  echocardiogram results may not be completely valid.   At the time of discharge, the patient was feeling well, was having no  pain.  Patient was ambulating in the halls without difficulty.  Patient  understands the need to follow a diabetic diet and lose  a significant  amount of weight.      Bruce Rexene Edison Swords, MD  Electronically Signed     BHS/MEDQ  D:  09/05/2007  T:  09/05/2007  Job:  [id number]

## 2011-01-01 NOTE — Assessment & Plan Note (Signed)
[facility]                                 ON-CALL NOTE   [name], Foley                        MRN:          [id number]  DATE:09/24/2007                            DOB:          1975/09/17    Telephone conversation with Milford Cage concerning [name] Bi.   I received a call through the answering service on February 5 at 5:58  p.m. from Ms. Milford Cage.  She states her son, [name] Foley, is  a patient of Dr. Jesusita Oka Bensimhon's.  She is calling concerning his  symptoms and blood in his urine.  Mr. Foley date of birth is 1976-08-02.  She states he is having abdominal pain and blood in his  urine.  I instructed her that he needed to be seen in the emergency room  as it was after office hours and this did not sound like a cardiac  problem and he would require further evaluation.  She stated she could  not get him to come to the emergency room.  I stated that if he was  alert and oriented and a competent 35 year old gentleman we could not  make him come to the emergency room and I was unable to diagnose or  treat him over the phone, and that if his status changed or he was  become unresponsive she was to call 9-1-1.  Otherwise, the only thing we  could do is have the emergency room physician assess him.  I asked her  to try and encourage him to come in.  She stated she would.  Also note,  mother reports the patient is not on any medications that have an  anticoagulation effect or blood thinner effect.      Dorian Pod, ACNP  Electronically Signed      Luis Abed, MD, [facility]  Electronically Signed   MB/MedQ  DD: 09/25/2007  DT: 09/27/2007  Job #: [id number]

## 2011-01-01 NOTE — H&P (Signed)
[name] Foley, [name] Foley               ACCOUNT NO.:  [id number]   MEDICAL RECORD NO.:  [id number]          PATIENT TYPE:  INP   LOCATION:  2923                         FACILITY:  [facility]   PHYSICIAN:  Bevelyn Buckles. Bensimhon, MDDATE OF BIRTH:  [birth year]   DATE OF ADMISSION:  10/23/2007  DATE OF DISCHARGE:                              HISTORY & PHYSICAL   PRIMARY CARE PHYSICIAN:  [name], MD.   PATIENT PROFILE:  A 35 year old, obese, Caucasian male with prior  history of cardiomyopathy who presented for cardiac catheterization  today revealing multivessel disease.  The patient is being admitted for  cardiothoracic surgical evaluation.   PROBLEM LIST:  1. Coronary artery disease.  On October 23, 2007, cardiac catheterization      with left main 20% , LAD 99% mid, 80% distal, D-1 occluded, D-2      99%, ramus intermedius 90% proximal, 95-99% mid, left circumflex      70% proximal, 99% mid with subtotal occlusion distally.  RCA 70-80%      distal.  EF 20% with anterior and intra-apical akinesis.  Right      heart catheterization showed RA of 4, RV 55/0, PA 50/20 with a mean      of 34, wedge of 30, mean of 23.  Cardiac output 5.3, index 1.9, PVR      2.1.  2. Ischemic cardiomyopathy/chronic systolic congestive heart failure.      On September 04, 2007, a 2-D echocardiogram with EF 30-35%, mildly      dilated left atrium.  3. Morbid obesity.  4. Type 2 diabetes mellitus.  5. Ongoing tobacco abuse.  6. History of marijuana usage hypertension.  7. History of abdominal discomfort.   HISTORY OF PRESENT ILLNESS:  A 35 year old, obese, Caucasian male with  the above problem.  He was admitted to [facility] on September 04, 2007, with abdominal discomfort and was found to have elevated  troponins.  Follow-up 2-D echocardiogram showed an EF 30-35%.  His  abdominal discomfort was evaluated with a CT of the abdomen which was  negative.  The patient was initiated on medication for  presumed  nonischemic cardiomyopathy including beta-blocker and ACE inhibitor.  Follow up with Dr. Gala Foley.  The patient preferred to defer ischemic  evaluation with cardiac catheterization at that point and underwent CTX  testing which did not implicate heart failure as his primary reason for  dyspnea.  With his risk factors, the decision was made to pursue with  cardiac catheterization  which was scheduled for today.  Catheterization  today revealed significant multivessel disease as outlined by problem  list.  The patient is pending cardiothoracic surgery evaluation.  He is  currently pain-free.   ALLERGIES:  No known drug allergies.   HOME MEDICATIONS:  1. Lasix 20 mg daily.  2. Altace 10 mg daily.  3. Metformin 1000 mg b.i.d.  4. Digoxin 250 mcg daily.  5. Potassium 20 mEq daily.  6. Coreg 6.25 mg b.i.d.   FAMILY HISTORY:  Both of his parents are alive.  His mother is currently  age 42.  She is status post CABG at age 48.  She also has a history of  hyperlipidemia.  Father has a history of hypertension, diabetes as well  as VT and is status post ICD placement.  Denies any prior history of  coronary disease.  He has one brother and sister, both are alive and  well.   SOCIAL HISTORY:  He lives in [place] with his parents.  He smokes  about a half pack a day and has smoked up to a pack a day.  He has only  been smoking for about 3 years.  He denies any alcohol or drug use  today, however, there is previous documentation of marijuana usage.  The  patient does not routinely exercise.   REVIEW OF SYSTEMS:  Positive for exertional chest discomfort and  dyspnea.  Also positive for history of abdominal discomfort and  intermittent bloody stools.  Otherwise, all systems reviewed negative.   PHYSICAL EXAMINATION:  VITAL SIGNS:  Blood pressure 130/80, heart rate  72, respirations 16, afebrile.  GENERAL:  A pleasant, obese, white male in no acute distress.  Awake and  alert  x3.  NECK:  Obese, difficult to assess JVP.  No bruits.  LUNGS:  Respirations were unlabored.  Clear to auscultation.  CARDIAC:  Regular S1, S2.  No S3, S4 or murmurs.  ABDOMEN:  Obese, soft, nontender, nondistended.  Bowel sounds present.  EXTREMITIES:  Lower extremities dry, pink, no clubbing, cyanosis or  edema.  Dorsalis pedis and posterior tibial pulses 2+ and equal  bilaterally.  The right groin site, which was used for catheterization,  is clear for bleeding, bruising or hematomas.  SKIN:  Warm and dry without lesions or masses.  HEENT:  Normal.  NEUROLOGIC:  Grossly intact and nonfocal.   LABORATORY DATA AND X-RAY FINDINGS:  Lab work from February 25, shows a  hemoglobin of 16.6, hematocrit 48.8, WBC 6.8, platelets 197, INR 1.0.  Sodium 137, potassium 4.0, chloride 106, CO2 26, BUN 11, creatinine 0.9,  glucose 266, calcium 9.1.   ASSESSMENT/PLAN:  1. Coronary artery disease.  The patient presented for catheterization      today revealing multivessel disease.  He is awaiting cardiovascular      thoracic surgery evaluation.  Will initiate aspirin and continue      beta-blocker and ACE inhibitor therapy.  Will also add to that and      check lipids and LFTs.  Plan to add heparin 6 hours post      catheterization.  I will continue to hold his metformin in the      setting of a catheterization today.  2. Chronic systolic congestive heart failure/ischemic cardiomyopathy.      The patient had elevated filling pressures and will plan to      initiate intravenous Lasix.  Continue his digoxin, beta-blocker and      ACE inhibitor.  Pending decision with regards to revascularization,      followup ejection fraction in approximately 2-3 months for      consideration of implantable cardioverter-defibrillator placed.  3. Hypertension, currently stable.  4. Lipid status, currently unknown.  Check lipids and LFTs and will      add statin therapy.  5. Type 2 diabetes mellitus.  Metformin is  currently on hold in the      setting of recent contrast usage.  Will add sliding-scale insulin.      The patient may require Lantus.  6. Morbid obesity.  The patient will benefit  from cardiac      rehabilitation post revascularization.  7. Tobacco abuse.  Smoking cessation strongly advised.  Will obtain      smoking cessation consult.  8. Marijuana usage.  Cessation advised.      Nicolasa Ducking, ANP      Bevelyn Buckles. Bensimhon, MD  Electronically Signed    CB/MEDQ  D:  10/23/2007  T:  10/24/2007  Job:  [id number]

## 2011-01-01 NOTE — Letter (Signed)
Dec 31, 2007    [employer]   RE:  [name], [name]  MRN:  [id number]  /  DOB:  1976-03-27   To whom it may concern:   [name] Foley is a 35 year old man with a history of severe premature  coronary artery disease that is not amenable to bypass surgery due to  severe distal targets.  This is also complicated by severe congestive  heart failure ejection fraction of 20%.  He recently underwent  angioplasty and stenting of his mid LAD to try to improve some of his  septal circulation and ameliorate his heart failure symptoms.  Based on  his class III heart failure symptoms he has been unable to work.  He has  applied for disability.  This has not yet come through.  He has been  denied coverage for his Plavix.  Based on his severe coronary artery disease and heart failure at such a  young age, I think he would definitely benefit from lifelong Plavix.  I  think this is crucial for him.  As he is not able to work due to his  heart failure and he does not have any other source of income at this  point, I would appreciate any help he can get through a drug assistance  program in obtaining his Plavix.   Once again, thank you very much.  If you have any questions please do  not hesitate to contact me directly.    Sincerely,      Bevelyn Buckles. Bensimhon, MD  Electronically Signed    DRB/MedQ  DD: 12/31/2007  DT: 12/31/2007  Job #: [id number]

## 2011-01-01 NOTE — Discharge Summary (Signed)
NAME:  [name] Foley, [name] Foley               ACCOUNT NO.:  [id number]   MEDICAL RECORD NO.:  [id number]          PATIENT TYPE:  INP   LOCATION:  4707                         FACILITY:  [facility]   PHYSICIAN:  Jesse Sans. Wall, MD, FACCDATE OF BIRTH:  [birth year]   DATE OF ADMISSION:  10/23/2007  DATE OF DISCHARGE:  10/25/2007                               DISCHARGE SUMMARY   PRIMARY CARDIOLOGIST:  Bevelyn Buckles. Bensimhon, MD   PRIMARY CARE PHYSICIAN:  [name], MD.   PROCEDURES PERFORMED DURING HOSPITALIZATION:  Cardiac catheterization  performed by Dr. Arvilla Meres on October 23, 2007 revealing severe  three-vessel coronary artery disease with diabetic vascular severe left  ventricular dysfunction with an ejection fraction of 20% with increased  filling pressures.   FINAL DISCHARGE DIAGNOSES:  1. Ischemic cardiomyopathy with chronic systolic congestive heart      failure with an ejection fraction of 20%.  2. Coronary artery disease.      a.     Severe three-vessel coronary artery disease in diabetic with       a 99% focal lesion in the left anterior descending in the proximal       to mid section. The left main had a proximal 20% lesion and a       distal 20% lesion. The mid to distal section of the left anterior       descending had diffuse 80% lesion and totally occluded about two-       thirds of the way down. There were several diagonal branches, all       of which were heavily diseased. The left circumflex was       nondominant. It gave off large ramus branch, a small to moderate       size first obtuse marginal artery and a small second obtuse       marginal artery. There was 70% diffuse stenosis throughout the       proximal and mid circumflex. In the AV groove of the circumflex,       there was diffuse 99% lesion which was subtotally occluded. The       first obtuse marginal artery had diffuse disease and was totally       occluded in the mid to distal section. The second obtuse  marginal       artery was diffusely diseased and fills only moderately. The right       coronary artery is large and dominant giving off a large acute       marginal branch, a small to moderate posterior descending artery       and posterolateral. There was a 70-80% lesion in the distal bend       of the right coronary artery after the take-off of the acute       marginal. The distal right coronary artery was totally occluded       after the posterior descending artery. There were right-to-right       collaterals filling the posterolateral system with 50% diffuse       lesion in the marginal branch. The  subclavian angiography showed       patent subclavian artery without any stenosis. The LIMA was also       patent. The left ventricular ejection fraction was found to be 20%       with a dilated ventricle.      b.     Nonoperative candidate for coronary artery bypass grafting       per Dr. Laneta Simmers. It was his opinion that he is too high risk of a       surgical candidate due to his core morbidity, diffuse disease       especially in the distal vessels that are non-graftable with       prognosis poor.  3. Morbid obesity.  4. Type 2 diabetes, not well controlled.  5. Ongoing tobacco abuse.  6. History of marijuana usage.  7. History of abdominal discomfort.   HOSPITAL COURSE:  This is a 35 year old morbidly obese Caucasian male  with prior history of cardiomyopathy who presented for cardiac  catheterization for Dr. Arvilla Meres in the setting of known  coronary artery disease. The patient was also admitted for  cardiothoracic surgical evaluation in the setting of severe coronary  artery disease. The patient was seen by Dr. Laneta Simmers in consultation  concerning this and found not to be a surgical candidate secondary to  very high risk core morbidities and poor distal grafts not amendable to  bypass. Dr. Laneta Simmers also felt that secondary to his weight that he was a  very high risk and  having very little benefit from surgery. This was  discussed with the patient and family who verbalized understanding.   The patient was diuresed secondary to systolic congestive heart failure  with IV Lasix, and although I do not have I's and O's, the patient had  diuresed well enough to have his breathing increased with no further  edema in the lower extremities, and his breathing status improved  markedly. The patient was started on spironolactone 12.5 mg b.i.d. His  carvedilol was increased to 12.5 mg b.i.d. and Lasix was increased from  20 mg a day to 40 mg b.i.d. after  __________ was discontinued. The  patient was also on a heparin drip throughout hospitalization, and this  was discontinued on the day of discharge. The patient was seen and  examined by myself and Dr. Juanito Doom on the day of discharge and was  without complaint. It appeared during assessment as I saw him that he  was having sleep apnea as he was sleeping on my arrival to the room and  was sort of hard to arouse. The patient had no complaints and was  anxious to go home.   DISCHARGE LABS:  Sodium 137, potassium 3.4, chloride 101, CO2 25, BUN  16, creatinine 0.8, glucose 208, hemoglobin 17, hematocrit 48.9, white  blood cells 8.7, platelets 191. Telemetry revealing sinus rhythm with  PACs in the 70s. Weight on discharge 156.7 kg.   DISCHARGE MEDICATIONS:  1. Altace 10 mg daily.  2. Digoxin 0.25 mg daily.  3. Potassium 20 meq daily.  4. Zocor 80 mg daily.  5. Aspirin 325 daily.  6. Coreg 12.5 mg twice a day.  7. Spirolactone 12.5 mg twice a day.  8. Lasix 40 mg twice a day.  9. Metformin 1,000 mg twice a day.   ALLERGIES:  No known drug allergies.   FOLLOWUP PLANS AND APPOINTMENT:  1. The patient is to follow up with Dr. Arvilla Meres within one  to      two weeks for re-evaluation of the patient's status and      continuation of ischemic cardiomyopathy and congestive heart      failure management.  2.  The patient is to followup with Dr. Amador Cunas, primary care      physician, for continued management of diabetes. The patient may      need adjustment of these medications or addition of insulin at his      discretion.  3. The patient has been given post-cardiac catheterization      instructions with particular emphasis on the right groin site for      evidence of bleeding, hematoma, or infection.  4. The patient has been advised to weigh himself daily and congestive      heart failure information has been provided.   Time spent with the patient to include physician time 40 minutes.      Bettey Mare. Lyman Bishop, NP      Jesse Sans. Daleen Squibb, MD, [facility]  Electronically Signed    KML/MEDQ  D:  10/25/2007  T:  10/25/2007  Job:  [id number]   cc:   [name], MD

## 2011-01-01 NOTE — Discharge Summary (Signed)
NAME:  [name] Foley, [name] Foley               ACCOUNT NO.:  [id number]   MEDICAL RECORD NO.:  [id number]          PATIENT TYPE:  INP   LOCATION:  6529                         FACILITY:  [facility]   PHYSICIAN:  Bairon Klemann. Excell Seltzer, MD  DATE OF BIRTH:  August 24, 1975   DATE OF ADMISSION:  11/19/2007  DATE OF DISCHARGE:  11/20/2007                               DISCHARGE SUMMARY   PRIMARY CARDIOLOGIST:  Bevelyn Buckles. Bensimhon, M.D.   DISCHARGE DIAGNOSIS:  Coronary artery disease.   SECONDARY DIAGNOSES:  1. Ischemic cardiomyopathy.  2. Morbid obesity.  3. Hypertension.  4. Hyperlipidemia.  5. Type 2 diabetes mellitus.  6. Chronic systolic congestive heart failure.  7. Ongoing tobacco abuse.   ALLERGIES:  No known drug allergies.   PROCEDURES:  Successful PCI and stenting of the proximal LAD, placement  of a 3.0 x 18 mm Promus drug-eluting stent.   HISTORY OF PRESENT ILLNESS:  A [name] with prior  history of severe premature coronary artery disease and resultant  ischemic cardiomyopathy with an EF of 25% who is recently seen by Dr.  [name] in clinic complaining of occasional chest discomfort and  continued dyspnea on exertion.  He has previously been evaluated for a  transplant at [facility]; however, secondary to the patient's obesity, he is  not an optimal candidate for transplantation or other advanced therapies  at this point.  He was admitted to pursue PCI to the mid LAD to help  improved perfusion.   HOSPITAL COURSE:  The patient presented to the cath lab on November 19, 2007, and underwent successful PCI and stenting of the proximal LAD with  the placement of a 3.0 x 18 mm Promus drug-eluting stent.  The patient  tolerated the procedure well.  After that procedure, he has been  ambulating without recurrent chest discomfort or dyspnea.  He is being  discharged to home today in satisfactory condition.   DISCHARGE LABS:  Hemoglobin 14.4, hematocrit 40.7, WBC 6.2, platelets  168,  and MCV 92.9.  Sodium 137, potassium 3.5, chloride 105, CO2 of 25,  BUN 11, creatinine 0.75, glucose 196.  CK 63 and MB 2.0, troponin I  0.76, and calcium 8.8.   DISPOSITION:  The patient is being discharged home today in good  condition.   FOLLOWUP PLANS AND APPOINTMENT:  We have arranged for followup with Dr.  [name] on December 04, 2007, at 10:15.  He has a follow up with Dr.  Amador Cunas as previously scheduled.   DISCHARGE MEDICATIONS:  1. Aspirin 325 mg daily.  2. Plavix 75 mg daily.  3. Altace 10 mg daily.  4. Metformin 1000 mg b.i.d. to be resumed on November 23, 2007.  5. Digoxin 0.25 mg daily.  6. Potassium 20 mEq daily.  7. Zocor 80 mg nightly.  8. Coreg 12.5 mg b.i.d.  9. Lasix 40 mg b.i.d.  10.Spironolactone 12.5 mg b.i.d.  11.Nitroglycerin 0.4 mg sublingual p.r.n. chest pain.   OUTSTANDING LABORATORY STUDIES:  None.   DURATION OF DISCHARGE/ENCOUNTER:  45 minutes including physician time.      [name] Foley, ANP  [name] Foley. Excell Seltzer, MD  Electronically Signed    CB/MEDQ  D:  11/20/2007  T:  11/21/2007  Job:  [id number]   cc:   [name], MD

## 2011-01-01 NOTE — H&P (Signed)
NAME:  [name] Foley, [name] Foley               ACCOUNT NO.:  [id number]   MEDICAL RECORD NO.:  [id number]           PATIENT TYPE:   LOCATION:                                 FACILITY:   PHYSICIAN:  Bevelyn Buckles. Bensimhon, MD     DATE OF BIRTH:   DATE OF ADMISSION:  11/10/2007  DATE OF DISCHARGE:                              HISTORY & PHYSICAL   INTERVAL HISTORY:  [name] Foley is a 35 year old male with severe premature  coronary artery disease and resultant ischemic cardiomyopathy with  ejection fraction about 25%.  He recently underwent catheterization  which showed severe three-vessel coronary artery disease.  He had  significant diabetic vasculopathy with diffuse distal disease.  He was  evaluated by Dr. Evelene Croon of cardiothoracic surgery and thought not  to be a candidate for coronary artery bypass grafting.  Thus his options  are quite limited.  I have reviewed his coronary angiograms with Dr.  Versie Starks, director of cardiac transplant at [facility]. Unfortunately, due to [name] Foley's size he is not he is not a  optimal candidate for cardiac transplantation or other advanced  therapies.  We decided to proceed with percutaneous intervention on the  mid LAD the patient to try to improve his perfusion to his proximal mid  inferior wall as well as his septal perforators.  He continues to have  class III heart failure symptoms with stable exertional angina as well.  We are hopeful angioplasty will give him symptomatic relief.  He has not  had any unstable symptoms.   Other past medical history includes a morbid obesity, diabetes,  hypertension.   CURRENT MEDICATIONS:  1. Altace 10 mg a day.  2. Digoxin 0.25 mg a day.  3. Potassium 20 mEq a day.  4. Zocor 80 a day.  5. Aspirin 325 a day.  6. Coreg 12.5 b.i.d.  7. Spirolactone 12.5 mg a day.  8. Lasix 40 mg b.i.d.  9. Metformin b.i.d.   ADMISSION LABORATORY DATA:  He has no known drug allergies.   CONCLUSION:  [name] Foley  will be brought into the [facility] on  Tuesday, November 10, 2007 for percutaneous intervention and stenting of  his LAD by Dr. Tonny Bollman.  I have discussed the risks and benefits  of this with him his family previously and they are willing to proceed.      Bevelyn Buckles. Bensimhon, MD  Electronically Signed     DRB/MEDQ  D:  11/08/2007  T:  11/09/2007  Job:  [id number]

## 2011-01-01 NOTE — Assessment & Plan Note (Signed)
[facility]                            CARDIOLOGY OFFICE NOTE   [name], Foley                      MRN:          [id number]  DATE:10/14/2007                            DOB:          1976-07-12    PRIMARY CARE PHYSICIAN:  [name] Savers, MD.   INTERVAL HISTORY:  [name] Foley is a 35 year old male with a history of morbid  obesity, hypertension and diabetes.  He also has a history of congestive  heart failure due to presumed nonischemic cardiomyopathy with an EF of  30-35% which was recently diagnosed.  He presents today for routine  follow-up.   He underwent CPX testing two weeks ago which showed a peak VO2 of 15.  When adjusted to his ideal body weight, this was 28.1, which is  relatively normal.  His slope was 31 RER was 1.13, OUES was 2.1, hisVEVV  was 90%.  Overall this showed a moderate functional limitation with a  main limitation be ventilatory in nature and not particularly heart  failure.  However, he presents today saying he continues to feel very  weak.  He has a hard time doing almost anything.  When he gets up, he is  short of breath and also has chest pain.  Last week or two, he had  several episodes of bright red blood per rectum.  He said he had been  having some intermittent abdominal pain as well but this did not  necessarily relate to his bloody stools.  He denies any history of known  hemorrhoids.   CURRENT MEDICATIONS:  1. Lasix 20 a day.  2. Altace 10 a day.  3. Metformin 1000 b.i.d.  4. Digoxin 250 mcg a day.  5. Potassium 20 a day.  6. Coreg 6.25 b.i.d.   PHYSICAL EXAMINATION:  GENERAL APPEARANCE:  He is a young male in no  acute distress.  He ambulates around the clinic without any respiratory  difficulty.  VITAL SIGNS:  Blood pressure is 142/94, heart rate 79, weight 362 which  is up about 5 pounds.  HEENT:  Normal.  NECK:  Supple.  No obvious JVD.  Carotid are 2+ bilateral bruits.  There  is no  lymphadenopathy or thyromegaly.  CARDIAC:  PMI is not palpable.  He is regular with no murmurs, rubs or  gallops.  There is no S3.  LUNGS:  Clear.  ABDOMEN:  Obese, nontender, nondistended.  No hepatosplenomegaly, no  bruits, no masses.  Good bowel sounds.  EXTREMITIES:  Warm with no cyanosis, clubbing or edema.  No rash.  NEURO:  Alert and x3.  Cranial nerves II-XII are intact.  Moves all four  extremities without difficulty.  Affect is flattened.   EKG shows normal sinus rhythm at a rate of 76, no significant ST-T wave  abnormalities.   ASSESSMENT/PLAN:  1. Congestive heart failure.  He continues to complain of a class III      symptoms.  However, his CPX looks better than that.  Given his      diabetes and his chest pain, I do think it is, at this point,  reasonable to proceed with right and left heart catheterization to      evaluate his filling pressures, outputs and coronary arteries.  We      will plan this for early next week.  2. Bright red blood per rectum.  I am unsure of the cause of this.      Obviously, ischemic colitis is in the differential but he does not      really seem to me to be in a low flow state.  I will have him      referred to GI for further evaluation.  3. Hypertension.  Continue current therapy.  Once we complete his      catheterization, I suspect we will be able to titrate up his Coreg.  4. Diabetes.  This is followed by Dr. Amador Foley.  Given his heart      failure, we may have to switch this over to Amaryl.  It depends on      whether he is really low output or not.  Will make this decision      next week at cath.     [name] Buckles. Bensimhon, MD  Electronically Signed    DRB/MedQ  DD: 10/14/2007  DT: 10/15/2007  Job #: [id number]   cc:   [name] Savers, MD

## 2011-01-01 NOTE — Cardiovascular Report (Signed)
NAMERAYLIN, WINER               ACCOUNT NO.:  [id number]   MEDICAL RECORD NO.:  [id number]          PATIENT TYPE:  INP   LOCATION:  2923                         FACILITY:  [facility]   PHYSICIAN:  Bevelyn Buckles. Bensimhon, MDDATE OF BIRTH:  26-Jul-1976   DATE OF PROCEDURE:  10/23/2007  DATE OF DISCHARGE:                            CARDIAC CATHETERIZATION   PRIMARY CARE PHYSICIAN:  Dr. Eleonore Chiquito.   PATIENT IDENTIFICATION:  [name] Foley is a 35 year old male with a history of  diabetes, hypertension and morbid obesity.  He was recently admitted to  the hospital for abdominal discomfort and heart failure and was found to  have an ejection fraction of 30%.  We initially thought this was a  nonischemic myopathy because his ventricle was dilated.  We were  treating him medically; however, he was having persistent heart failure  symptoms and dyspnea on exertion.  He is brought in today to the  outpatient lab for right and left heart catheterization.   PROCEDURES PERFORMED:  1. Right heart catheterization.  2. Left heart catheterization.  3. Selective coronary angiography.  4. Left subclavian angiography.   DESCRIPTION OF PROCEDURE:  The risks and indication of the  catheterization were explained and consent was signed and placed on the  chart.  A 4-French arterial sheath was placed in the right femoral  artery using a modified Seldinger technique.  Standard catheters  including a JL-5, 3-D RC and angled pigtail were used for  catheterization.  All catheter exchanges were made over a wire.  There  were no apparent complications.  A 7-French venous sheath was placed in  the right femoral vein using a modified Seldinger technique.  Standard  Swan-Ganz catheter was used for the right heart cath.  Once again, no  apparent complications.   HEMODYNAMIC RESULTS:  Central aortic pressure was 131/91 with a mean of  109.  LV pressure 123/10 with an EDP of 25.  There was no aortic  stenosis on  pullback across the aortic valve.  Right atrial pressure was  mean of 4, RV pressure 55/0, PA pressure 50/20 with a mean of 34.  Pulmonary capillary wedge pressure was a mean of 23 with a V wave to 30.  Fick cardiac output was 5.3 liters per minute.  Cardiac index was 1.9  liters per minute per meter squared.  Pulmonary vascular resistance  (PVR) was 2.1 Woods units.   CORONARY ANATOMY:  The left main had a proximal 20% lesion and a distal  20% lesion.   LAD gave off several diagonals.  There was a 99% focal lesion in the  proximal to mid section.  The mid to distal section had a diffuse 80%  lesion and was totally occluded about two-thirds of the way down.  There  were several diagonal branches, all which were heavily diseased.   Left circumflex was a nondominant system.  It gave off a large ramus  branch, a small- to moderate-sized OM-1 and a small OM-2.  There was a  70% diffuse stenosis throughout the proximal to mid circumflex.  In the  AV groove circumflex, there was  a diffuse 99% lesion which was  subtotally occluded.  The OM-1 had diffuse disease and was totally  occluded in the mid to distal section.  The OM-2 was diffusely diseased  and fills only mildly.   Right coronary artery is a large dominant vessel and gave off a large  acute marginal branch, a small- to moderate-sized PDA and  posterolateral.  There was a 70% to 80% lesion in the distal bend of the  RCA after the takeoff of the acute marginal.  Then the distal RCA was  totally occluded after the PDA.  There were right-to-right collaterals  filling the posterolateral system.  There was a 50% diffuse lesion in  the acute marginal branch.   LEFT SUBCLAVIAN ANGIOGRAPHY:  Showed a patent subclavian without any  stenosis.  The LIMA was patent to the chest wall.   LEFT VENTRICULOGRAM:  Done the RAO position.  The ventricle was  underfilled; however, ejection fraction was severely decreased.  The EF  was about 20% with a  dilated ventricle.  The inferior wall was akinetic.  The mid to distal anterior wall was akinetic.   ASSESSMENT:  1. 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  2. Severe left ventricular dysfunction.  3. Increased filling pressures.   We will consult Dr. Laneta Simmers from Cardiothoracic Surgery for possible  CABG, if his targets are suitable.  We will admit him for observation.      Bevelyn Buckles. Bensimhon, MD  Electronically Signed     DRB/MEDQ  D:  10/23/2007  T:  10/24/2007  Job:  [id number]   cc:   [name], MD

## 2011-01-01 NOTE — Consult Note (Signed)
NAME:  [name] Foley, CAZAREZ               ACCOUNT NO.:  [id number]   MEDICAL RECORD NO.:  [id number]          PATIENT TYPE:  INP   LOCATION:  4707                         FACILITY:  [facility]   PHYSICIAN:  Gerrit Friends. Dietrich Pates, MD, FACCDATE OF BIRTH:  1976-03-24   DATE OF CONSULTATION:  09/04/2007  DATE OF DISCHARGE:                                 CONSULTATION   REFERRING PHYSICIAN:  Valerie A. Felicity Coyer, MD.   PRIMARY CARE PHYSICIAN:  [name], MD.   HISTORY OF PRESENT ILLNESS:  A 35 year old gentleman admitted to  hospital with epigastric pressure, but found to have abnormal cardiac  markers, EKG abnormalities and radiographic evidence of possible  congestive heart failure.  Mr. Baisley has no prior cardiac history.  He  has never been seen by a cardiologist nor undergone any cardiac testing.  He has not experienced any chest discomfort.  He notes no dyspnea on  exertion, although his employment in a factory  requires considerable  physical activity.  He has lost weight in recent months.  He notes no  ankle edema, orthopnea nor PND.  He presented with mild-moderate  epigastric pressure present for a number of hours prior to admission.  There was no nausea, change in bowel habits or emesis associated with  this.  He has had some anorexia.  The abdominal films have been  unrevealing.   Mr. Prude reports a prolonged episode of bronchitis for approximately  the month prior to admission.  He was seen in the emergency department  on 2 occasions.  He was treated with a course of azithromycin and  appears to have been improving in recent days.   PAST MEDICAL HISTORY:  1. Notable for diabetes treated with oral agents.  2. Hypertension.  3. Obesity.  4. The possibility of sleep apnea has been raised, but a sleep study      has not been performed.   ALLERGIES:  NO KNOWN DRUG ALLERGIES.   MEDICATIONS:  1. Metformin.  2. Ramipril.   SOCIAL HISTORY:  No use of tobacco products or  alcohol; no use of  cocaine.   FAMILY HISTORY:  No prominent coronary disease.   REVIEW OF SYSTEMS:  Loud snoring.  All other systems reviewed and are  negative.   PHYSICAL EXAMINATION:  GENERAL:  On exam, overweight pleasant gentleman  with a somewhat flat affect in no acute distress.  VITAL SIGNS:  Heart rate is 105 and regular, respirations 20, blood  pressure 150/95, O2 saturation 97% on 2 liters, temperature 98.9.  Afebrile.  HEENT:  Anicteric sclerae; normal oral mucosa.  NECK:  No jugular venous distention; normal carotid upstrokes without  bruits.  ENDOCRINE:  No thyromegaly.  Hematopoietic:  No adenopathy.  LUNGS:  Clear.  CARDIAC:  Tachycardia; normal first and second heart sounds; no fourth  heart sound nor third heart sound appreciated.  ABDOMEN:  Obese; otherwise benign; minimal epigastric tenderness; normal  bowel sounds without masses nor organomegaly.  EXTREMITIES:  1/2+ ankle edema; distal pulses intact.  NEUROMUSCULAR:  Symmetric strength and tone; normal cranial nerves.  SKIN:  No significant lesions.  PSYCHIATRIC:  Alert and oriented; normal affect.   DIAGNOSTICS:  1. EKG:  Sinus tachycardia; left atrial abnormality; delayed R-wave      progression; nonspecific T-wave abnormality.  2. Chest x-ray:  Small effusions; borderline pulmonary edema.   LABORATORY DATA:  Notable for A1c level of 8.8, D-dimer of 1.05, ESR 2.   IMPRESSION:  Mr. Arizmendi presents with epigastric fullness, but is found  to have mildly elevated troponins with values centered around 0.22,  normal total CPK and MB, and an elevated BNP level of 362.  He certainly  could have some congestive heart failure, although he is virtually  asymptomatic.  With a recent history of a prolonged bronchitis, this  could reflect a viral cardiomyopathy.  An echocardiogram is pending  which is the test that is of most importance.  A TSH level will also be  obtained.  Alternatively, a serious bronchitis or  perhaps even a mild  pneumonia could account for most of the lab findings that are in  question.   We appreciate the request for consultation and will be happy to follow  this nice young gentleman with you.      Gerrit Friends. Dietrich Pates, MD, [facility]  Electronically Signed     RMR/MEDQ  D:  09/04/2007  T:  09/04/2007  Job:  [id number]

## 2011-01-01 NOTE — H&P (Signed)
NAME:  [name] Foley, [name] Foley               ACCOUNT NO.:  [id number]   MEDICAL RECORD NO.:  [id number]          PATIENT TYPE:  INP   LOCATION:  4707                         FACILITY:  [facility]   PHYSICIAN:  Hollice Espy, M.D.DATE OF BIRTH:  May 07, 1976   DATE OF ADMISSION:  09/03/2007  DATE OF DISCHARGE:                              HISTORY & PHYSICAL   PRIMARY CARE PHYSICIAN:  [name], MD   CHIEF COMPLAINT:  Abdominal pain.   HISTORY OF PRESENT ILLNESS:  The patient is a 35 year old, white male  with past medical history of morbid obesity, hypertension and diabetes  mellitus who, for the past 3 days, has had problems with severe sudden-  onset, mid epigastric abdominal pain.  He says he has not had any  previous episodes like this.  There was no previous warning and all of a  sudden he is having severe pain.  He describes this as sharp, as well as  cramping, right in the mid epigastric area.  He says it does not really  radiate to anywhere else in his abdomen, however, when you press on it,  it goes to both sides and goes around to his back.  He does not feel any  pain radiating directly through his body into his back, however.  He had  some mild associated nausea, but no real vomiting.  He has had no change  in his bowel habits.  He has just not felt very much like eating at all.  In regards to his breathing issues, he says that he noticed some  shortness of breath around the same time, but mostly describes it as an  inability to take a deep breath because it hurts too much in his mid  abdominal pain area.  He has had no associated chest pain and no other  complaints.  He denies any headaches, vision changes, dysphagia, chest  pain or palpitations, wheezing, coughing, no hematuria, dysuria,  constipation, focal extremity numbness, weakness or pain.  Review of  systems is otherwise negative.  The patient has had no change in his  diet.  When he came into the emergency room,  labs were done on the  patient which noted a normal lipase level, essentially normal  urinalysis, normal cardiac markers, normal CPK-MB, but his troponin was  found to be elevated at 0.22.  Repeat set confirmed this.  The only  other lab that was noted was an elevated D-dimer of 1.05 and a BNP of  362.  Chest x-ray showed evidence of cardiomegaly and very minimal  pleural effusions.  The patient underwent a CT angiogram of chest and  abdomen because of concerns about the possibility of CHF as well as  ruling out pulmonary embolus as well as evaluating abdominal pain.  The  radiologist's preliminary report shows no evidence of pulmonary embolus  and his CT of his abdomen, despite his morbid obesity, appears to be  completely normal.  Currently, the patient complains of continued  abdominal pain, mid epigastric.  He says the symptoms are similar and  not really changed.  He says they are  not really positional other than  he continues to have pain in his mid abdomen and regardless of whatever  way that he lies, sitting up to one side or on his back, he continues to  have this pain.  Review of systems otherwise negative.   PAST MEDICAL HISTORY:  1. Diabetes.  2. Hypertension.  3. Morbid obesity.   MEDICATIONS:  Metformin and Altace.   ALLERGIES:  No known drug allergies.   SOCIAL HISTORY:  He denies any tobacco or alcohol.  He smokes occasional  marijuana.   FAMILY HISTORY:  Noncontributory.   PHYSICAL EXAMINATION:  VITAL SIGNS:  Temperature 98.9, heart rate 116,  now down to 108, blood pressure 160/106, respirations 18, O2 saturations  97% on room air.  GENERAL:  He is alert and oriented x3 in mild distress secondary to  pain.  HEENT:  Normocephalic, atraumatic.  Mucous membranes are moist.  He has  no carotid bruits.  HEART:  Regular rhythm, mild tachycardia.  LUNGS:  Decreased inspiratory effort secondary to pain.  ABDOMEN:  Soft, obese, tender in the mid epigastric area, but  otherwise  nontender elsewhere.  Hypoactive bowel sounds.  EXTREMITIES:  No clubbing, cyanosis or edema.   LABORATORY DATA AND X-RAY FINDINGS:  Lipase level is 19 within normal  range.  UA with protein greater than 300, glucose 500.  Sodium 137,  potassium 4.3, chloride 104, bicarb 25, BUN 9, creatinine 0.9, glucose  249.  LFTs are unremarkable.  CPK 66, MB 2.4, troponin I 0.22.  Second  set is similar.  D-dimer is elevated at 1.05.  BNP 362.   ASSESSMENT/PLAN:  1. Mid epigastric abdominal pain.  It is very unclear as far as the      cause of this.  Will confirm final report of his abdominal CT.  It      is possible that his anatomy could be slightly off given some      obesity and this may be a possible cardiac issue, but I doubt this      very much given the fact that he looks at his shortness of breath      as baseline diaphragm irritation.  It is possible this could be a      gastric ulcer.  I do not think this is pancreatitis as his lipase      level is normal.  I do not think that this is mesenteric ischemia      because he has otherwise normal labs.  I have reviewed his EKG and      shows no evidence of diffuse ST elevation, so I do not think this      is pericarditis.  Will go ahead and check a sedimentation rate to      rule out other etiologies.  Will make the patient n.p.o., treat      with intravenous pain medication.  He may end up needing an      esophagogastroduodenoscopy to further delineate.  2. Diabetes mellitus.  He is n.p.o. on sliding scale.  3. Hypertension.  4. Cardiomegaly with mild effusions on chest x-ray.  He likely will      need an outpatient echocardiogram.  5. Obesity.      Hollice Espy, M.D.  Electronically Signed     SKK/MEDQ  D:  09/04/2007  T:  09/04/2007  Job:  [id number]   cc:   [name], MD  [facility]

## 2011-01-01 NOTE — Assessment & Plan Note (Signed)
[facility]                            CARDIOLOGY OFFICE NOTE   Foley, [name]                      MRN:          [id number]  DATE:09/18/2007                            DOB:          1975-12-31     CARDIOLOGY CLINIC NOTE.   PRIMARY CARE PHYSICIAN:  [name], MD   HISTORY:  Mr. Delima is a very pleasant 35 year old male with a history  of morbid obesity and hypertension and diabetes.  He was recently  admitted to [facility] about 2 weeks ago, with intermittent severe  abdominal discomfort.  His amylase and lipase were normal.  His troponin  was mildly elevated, and the BNP was about 370.  He underwent abdominal  and chest CT, which were unrevealing.  Then had an echocardiogram which  showed ejection an fraction of 30% to 35%.  His left ventricle was  dilated at 63 mm.  He was started on medications and discharged home.  He comes today for team followup.   He says he is feeling some better but continues to have intermittent  bouts of abdominal pain and just feels like he does not have much  energy.  He is trying to go to work but is calling out on certain days,  just because he does not have the energy.  He is not having angina, but  he does have occasional radiation of abdominal pain up into his chest  area.  His swelling is much improved with low-dose Lasix.   CURRENT MEDICATIONS:  1. Coreg 3.125 b.i.d.  2. Lasix 20 a day.  3. Altace 10 a day.  4. Metformin 1,000 b.i.d.   PHYSICAL EXAM:  GENERAL:  He is an obese male in no acute distress.  He  ambulates around the clinic without respiratory difficulty.  VITAL SIGNS:  Blood pressure is 120/92, heart rate is 99, weight is 359.  HEENT:  Normal.  NECK:  Thick.  It is hard to assess his JVD.  Carotids are 2+  bilaterally without bruits.  There is no lymphadenopathy or thyromegaly.  CARDIAC:  PMI is nonpalpable.  He is somewhat tachycardiac and regular.  No murmurs,  probable S3.  LUNGS:  Clear.  ABDOMEN:  Obese, nontender, nondistended.  Unable to examine for  hepatosplenomegaly.  No bruits, no masses.  EXTREMITIES:  Warm with no  cyanosis or clubbing.  There is trace edema.  NEURO:  He is alert and oriented x3.  Cranial nerves II-XII are grossly  intact.  Moves all fours extremities without difficulty.  Affect is  pleasant.   EKG shows normal sinus rhythm, a rate of 99, with a left anterior  fascicular block.  No ST-T wave abnormalities.   ASSESSMENT/PLAN:  1. Congestive heart failure, secondary to probable nonischemic      cardiomyopathy.  I am concerned that his abdominal symptoms may      represent a low output state.  However, his blood pressure is okay.      At this point, I am going start him on digoxin 0.25 a day and set  him up for a cardiopulmonary exercise test, to clearly evaluate his      performance, his functional capacity.  I am hesitant at this point      to go up on his beta blocker, in the face of possible low output      symptoms.  We did discuss the possibility of cardiac      catheterization to evaluate his outputs, as well as to rule out      ischemic heart disease, but we would like to defer at this point if      possible.  We will see him back in 2 weeks.  I told to him to call      me, if was he is getting worse.  Will also check a BMET and a BMP      today.  2. Sleep apnea.  He will likely need a sleep study in the near future.  3. Diabetes, followed by Dr. Amador Cunas.     Bevelyn Buckles. Bensimhon, MD  Electronically Signed    DRB/MedQ  DD: 09/18/2007  DT: 09/19/2007  Job #: [id number]   cc:   [name], MD

## 2011-01-01 NOTE — Assessment & Plan Note (Signed)
[facility]                          CHRONIC HEART FAILURE NOTE   [name], [name]                      MRN:          [id number]  DATE:09/30/2007                            DOB:          [birth year]    PRIMARY CARDIOLOGIST:  Dr. Arvilla Meres.   PRIMARY CARE:  Dr. Eleonore Chiquito.   [name] is a 35 year old Caucasian gentleman recently discharged from  [facility], followed by Dr. Nicholes Mango for new onset of  congestive heart failure most likely secondary to nonischemic  cardiomyopathy.  [name] was hospitalized in January of this year  secondary to acute abdominal pain, found to have a BMP of 370,  echocardiogram that showed EF of 30% to 35%.  He was started on  medications and discharged home and followed up with Dr. [name] later  that month for his congestive heart failure.  Dr. [name] felt that  the patient's abdominal symptoms might represent a low output state and  arranged for the patient to have a CPX test.  The patient states he just  had the CPX test done today, results of which are pending.  [name] is  a pleasant young man.  He lives at home with his mother.  He works 7  p.m. to 7 a.m. in a [employer], [employer].  He states it involves a  lot of lifting.  He has noticed over the last few weeks that he is  becoming increasingly fatigued.  He states after his 12-hour day, that  all he wants to do is sleep.  He has a very sedentary lifestyle anyway.  He does admit to eating a lot of fast food, no exercise.  He denies any  alcohol use, but states he smokes around 1 pack cigarettes a day.  He  denies any recreational substances either.  He states compliance with  his medications since he went home.  Diabetes is not well controlled.  He states he is still having some abdominal discomfort, but denies any  blood in his urine or bowel movements.   PAST MEDICAL HISTORY:  1. New diagnosis of congestive heart  failure felt to be secondary to      nonischemic cardiomyopathy with EF of 30% to 35% by echocardiogram.  2. Status post CPX test today, results pending.  3. Ongoing abdominal pain, status post recent hospitalization which      showed an amylase and lipase within normal limits, abdominal and      chest CT without acute findings.  4. Ongoing tobacco use.  5. Morbid obesity.  6. History of marijuana use.  7. Diabetes, poorly controlled.   REVIEW OF SYSTEMS:  As stated above, otherwise negative.   ALLERGIES:  NO KNOWN DRUG ALLERGIES.   CURRENT MEDICATIONS:  1. Coreg 3.125 mg b.i.d.  2. Furosemide 20 mg daily.  3. Altace 10 mg daily.  4. Metformin 1000 mg b.i.d.  5. Digoxin 250 mcg daily.  6. Potassium 20 mEq daily.   PHYSICAL EXAM:  Weight 357 pounds.  Blood pressure 143/89 with a heart  rate of 98.  NECK:  Difficult to assess for JVD secondary to body habitus.  CARDIOVASCULAR:  Exam reveals S1 and S2, slightly tachycardiac.  LUNGS:  Clear to auscultation.  ABDOMEN:  Obese, soft, nontender.  Positive bowel sounds.  Unable to  examine for hepatosplenomegaly.  LOWER EXTREMITIES:  Trace of edema.  NEUROLOGICAL:  Alert and oriented x3.   IMPRESSION:  Congestive heart failure secondary to probable nonischemic  cardiomyopathy with ejection fraction 30% to 35%.  At this time, I am  going to increase his carvedilol to 6.25 mg b.i.d., continue his current  medications, have Dr. [name] review CPX test and will discuss further  with the patient the results.  The patient is cleared to return to work  with lifting restrictions next week.      Dorian Pod, ACNP  Electronically Signed      Bevelyn Buckles. Bensimhon, MD  Electronically Signed   MB/MedQ  DD: 09/30/2007  DT: 10/02/2007  Job #: [id number]   cc:   [name], MD

## 2011-01-01 NOTE — Assessment & Plan Note (Signed)
[facility]                            CARDIOLOGY OFFICE NOTE   [name], Foley                      MRN:          [id number]  DATE:12/31/2007                            DOB:          05-02-1976    PRIMARY CARE PHYSICIAN:  [name] Foley, M.D.   INTERVAL HISTORY:  [name] Foley is a 35 year old male with a history of morbid  obesity, hypertension, diabetes, and congestive heart failure secondary  to severe ischemic cardiomyopathy with an ejection fraction of 20%.  He  had a cardiac catheterization just a month or 2 ago which showed severe  3-vessel coronary artery disease which was not amenable to surgical  revascularization.  He did undergo angioplasty of a portion of his LAD.  He returns today for routine followup.  He is doing much better and  feels like his breathing is improved.  He is not having any chest pain.  He is just having to take it easy though cause he gets winded easily.  He gets tired easily.  He has not had any orthopnea, no PND, no lower  extremity edema.  He has been having trouble getting his Plavix due to a  lack of insurance coverage.   CURRENT MEDICATIONS:  1. Altace 10 a day.  2. Metformin 1000 b.i.d.  3. Digoxin 0.25 a day.  4. Potassium 20 a day.  5. Zocor 80 a day.  6. Aspirin 325.  7. Coreg 12.5 b.i.d.  8. Spironolactone 12.5 b.i.d.  9. Lasix 40 b.i.d.  10.Plavix 75 a day.   PHYSICAL EXAMINATION:  GENERAL:  He is in no acute distress.  He  ambulates around the clinic without any respiratory difficulty.  VITAL SIGNS:  Blood pressure is 136/70, heart rate is 77, weight is 349  down 2 pounds.  HEENT:  Normal.  NECK:  Supple.  There is no JVD.  Carotids are 2 plus bilaterally  without any bruits.  There is no lymphadenopathy or thyromegaly.  CARDIAC:  PMI is not palpable.  He is regular with no murmurs, rubs, or  gallops.  LUNGS:  Clear.  ABDOMEN:  Obese, nontender, nondistended.  Unable to examine for any  hepatosplenomegaly.  No bruits.  No masses.  EXTREMITIES:  Warm with no cyanosis, clubbing, or edema.  No rash.  NEUROLOGIC:  Alert and oriented x3.  Cranial nerves II-XII are intact.  He moves all 4 extremities without difficulty.  Affect is pleasant.   EKG shows normal sinus rhythm with a left axis deviation and poor R wave  progression.  No acute ST-T wave abnormalities.   ASSESSMENT/PLAN:  1. Congestive heart failure secondary to severe ischemic      cardiomyopathy.  He is New York Heart Association Class III.  He is      on good medications.  We will go ahead and titrate his Coreg up to      18.75 twice a day.  Continue his ACE inhibitor.  2. Coronary artery disease.  This is stable.  Continue risk factor      modification.  I did suggest to him that he consider  an exercise      program walking 10-15 minutes twice a day or riding an exercise      bike to try to help build collateral circulation.  He does qualify      for cardiac rehabilitation but he does not have any way to pay for      this.  3. Diabetes.  Followed by Dr. Amador Foley.  4. Hypertension.  Blood pressure is elevated.  We are going up on his      Coreg.   DISPOSITION:  We will see him back in the clinic in about 6 weeks for  continued followup.     [name] Buckles. Bensimhon, MD  Electronically Signed    DRB/MedQ  DD: 12/31/2007  DT: 12/31/2007  Job #: [id number]   cc:   [name] Savers, MD

## 2011-01-01 NOTE — Assessment & Plan Note (Signed)
[name] Foley                            CARDIOLOGY OFFICE NOTE   [name] Foley                      MRN:          [id number]  DATE:04/27/2008                            DOB:          [birth year]    PRIMARY CARE PHYSICIAN:  [name] Savers, MD   INTERVAL HISTORY:  [name] Foley is a 35 year old male with a history of  morbid obesity, hypertension, diabetes, and congestive heart failure  secondary severe ischemic cardiomyopathy with ejection fraction of 20%.  He had a cardiac catheterization early this year, which showed severe  three-vessel coronary artery disease which was not amenable to bypass  surgery.  Due to his distal disease, he did undergo angioplasty of a  portion of his LAD.  He returns for routine followup.   Overall, he is feeling much better.  He denies any chest pain or  shortness of breath.  He is able to be hit some golf balls without  problems.  He has lost about 20 pounds.  He has not had any orthopnea or  edema.  He does have some occasional abdominal pain once or twice a  week, and also suffering from some depression.   CURRENT MEDICATIONS:  1. Altace 10 a day.  2. Metformin 1000 b.i.d.  3. Digoxin 0.25 a day.  4. Potassium 20 a day.  5. Zocor 80 a day.  6. Aspirin 325 a day.  7. Spirolactone 12.5 b.i.d.  8. Lasix 40 b.i.d.  9. Plavix 75 a day.  10.Coreg 18.75 b.i.d.   PHYSICAL EXAMINATION:  GENERAL:  He is in no acute distress.  Ambulates  in the clinic without any respiratory difficulty.  VITAL SIGNS:  Blood pressure is 136/84, heart rate 74, weight is 330.  HEENT:  Normal.  NECK:  Supple.  No obvious JVD.  Carotids are 2+ bilaterally without  bruits.  There is no lymphadenopathy or thyromegaly.  CARDIAC:  PMI is not palpable.  He is regular with no S3.  No murmur.  LUNGS:  Clear.  ABDOMEN:  Obese, nontender, and nondistended.  No obvious  hepatosplenomegaly.  No bruits, no masses.  Good bowel sounds.  EXTREMITIES:  Warm with no cyanosis, clubbing, or edema.  No rash.  NEURO:  Alert and oriented x3.  Cranial nerves II-XII are intact.  Moves  all 4 extremities without difficulty.   ASSESSMENT AND PLAN:  1. Congestive heart failure secondary to severe ischemic      cardiomyopathy.  He is doing well.  His is NYHA functional class      II.  Continue current therapy except for titrating the Coreg to 25      b.i.d.  2. Coronary artery disease is stable.  No evidence of ischemia.      Continue current therapy.  3. Obesity.  I congratulate him on his weight loss and encouraged him      to continue.   DISPOSITION:  We will see him back in a couple of months for followup.     [name] Buckles. Bensimhon, MD  Electronically Signed    DRB/MedQ  DD: 04/27/2008  DT:  04/28/2008  Job #: [id number]   cc:   [name] Savers, MD

## 2011-01-04 NOTE — Consult Note (Signed)
[name] Foley, [name] Foley               ACCOUNT NO.:  [id number]   MEDICAL RECORD NO.:  [id number]          PATIENT TYPE:  INP   LOCATION:  2923                         FACILITY:  [facility]   PHYSICIAN:  Evelene Croon, M.D.     DATE OF BIRTH:  May 05, 1976   DATE OF CONSULTATION:  10/23/2006  DATE OF DISCHARGE:                                 CONSULTATION   REFERRING PHYSICIAN:  Bevelyn Buckles. Bensimhon, MD   REASON FOR CONSULTATION:  Severe three-vessel coronary disease with  severe left ventricular dysfunction and angina/congestive heart failure.   CLINICAL HISTORY:  I was asked by Dr. [name] to evaluate this 35-year-  old gentleman for consideration of coronary artery bypass graft surgery.  He is a morbidly obese white male with a history of heavy smoking and  poorly controlled diabetes who has felt poorly for greater than 1 year  but began feeling worse in January of 2009.  He was admitted to [facility] with the diagnosis of congestive heart failure with symptoms of  shortness of breath, weakness, and edema.  An echocardiogram apparently  showed an ejection fraction of 30% to 35%, and he was felt initially to  having nonischemic cardiomyopathy.  He had a CT scan of the chest.  At  that time, it showed no evidence of pulmonary embolism and smaller  moderate bilateral pleural effusions.  There was diffuse peribronchial  thickening.  There was no evidence of pericardial effusion.  There was  no evidence of aortic dissection.  The patient was treated with  diuresis, and his edema improved.  His BNP was in the 300 to 400 range  at that time.  He improved somewhat and was discharged home on medical  therapy but has been followed in the office and continued to have  symptoms of congestive heart failure as well as intermittent left-sided  chest pain.  He subsequently underwent a cardiopulmonary stress test,  which I do not have the results of but reportedly showed a moderate  functional limitation  which is felt to be ventilatory in nature and not  particularly heart failure.  Due to his continued symptoms, he underwent  cardiac catheterization today.  This showed severe diffuse three-vessel  coronary artery disease.  The LAD had 99% proximal to mid-focal stenosis  and then an 80% mid-vessel stenosis.  This vessel was occluded at the  junction of the middle and distal third with the distal vessel barely  visible and it appeared small and diffusely diseased.  There was an  occluded first diagonal and the second diagonal was small with 99%  proximal stenosis.  The left circumflex gave off an intermediate or  first marginal branch that had 90% proximal and 95% and 99% mid-vessel  stenosis.  The distal vessel was relatively small and diffusely  diseased.  The remainder of the left circumflex was likewise diffusely  diseased and small.  The right coronary artery had 70% to 80% distal  stenosis before the posterior descending branch.  The right coronary  artery was occluded after the posterior descending with the collaterals  to the posterolateral  branch.  Left ventricular ejection fraction is  about 20% with a dilated left ventricle with akinesis of the anterior  apical and inferoapical walls.  Pulmonary pressure was elevated at 50/20  with a mean of 34.  Wedge pressure was 23 mean with a V wave of 30.  Cardiac index was 1.9.  Pulmonary vascular resistance was 2.1.  There is  no gradient across the aortic valve.  Right ventricular pressure was  55/0 with a right atrial pressure of 4.   REVIEW OF SYSTEMS:  GENERAL:  He denies any fever or chills.  Has had no  recent weight changes.  He has been fatigued and gets tired with minimal  activity around his house.  He has been calling in to work sick a lot  and basically stays in his room at home, according to his parents.  He  has a very sedentary lifestyle.  EYES:  Negative.  ENT:  Negative.  ENDOCRINE:  He has poorly controlled diabetes but  says he checks his  blood glucose at home.  The last hemoglobin A1C I can find was about  8.7.  He denies hypothyroidism.  CARDIOVASCULAR:  As above.  He has  intermittent substernal chest pain with activity.  He describes this as  coming around from the left side to the substernal region.  This is  usually relieved with rest.  He reports some dyspnea on exertion as well  as some orthopnea.  No PND.  His peripheral edema has been markedly  improved with diuretics.  Denies palpitations.  RESPIRATORY:  He denies  cough and sputum production.  GASTROINTESTINAL:  Denies nausea,  vomiting.  He has a history of bright red blood per rectum but none  lately.  He denies nausea or vomiting.  GENITOURINARY:  Denies dysuria  and hematuria.  NEUROLOGIC:  He denies any focal weakness or numbness.  Denies dizziness and syncope.  Has never had a TIA or a stroke.   ALLERGIES:  None.   PAST MEDICAL HISTORY:  1. Significant for poorly controlled diabetes.  2. History of morbid obesity.  3. History of congestive heart failure as mentioned above.  4. History of abdominal pain and bright red blood per rectum and has      undergone work up for that.  His amylase and lipase were normal,      and abdominal and chest CTs showed  no abnormality.   SOCIAL HISTORY:  He works 7 p.m. to 7 a.m. for a [employer].  He is single and lives with his parents.  He has smoked  greater than 1 pack of cigarettes per day for at least 7 years and said  he is trying to quit over the past couple of months and only smoking  about 1/2  pack of cigarettes per day.  He reports marijuana use in the  past but none recently.  He denies any IV drug abuse.  He denies alcohol  abuse.   FAMILY HISTORY:  Positive for cardiac disease.  His mother had coronary  bypass surgery.   PHYSICAL EXAMINATION:  His blood pressure is 136/84 and a pulse of 78  and regular.  Respiratory rate is 18, unlabored.  He is a morbidly obese   white male in no distress.  HEENT:  Shows him to be normocephalic and atraumatic.  Pupils are equal  and reactive to light and accommodation.  Extraocular muscles are  intact.  His throat is clear.  NECK:  Shows  normal carotid pulses bilaterally.  There are no bruits.  There is no adenopathy or thyromegaly.  CARDIAC:  Shows a regular rate and rhythm with normal S1 and S2.  There  is no murmur, rub, or gallop.  His lungs are clear.  ABDOMEN:  Shows active bowel sounds.  His abdomen is soft, morbidly  obese and nontender.  There are no palpable masses, organomegaly.  EXTREMITY:  Shows no peripheral edema.  Pedal pulses are palpable  bilaterally.  SKIN:  Warm and dry.  NEUROLOGIC:  Shows to be alert and oriented x3.  Memory and sensory exam  is grossly normal.   Laboratory examinations are pending.   IMPRESSION:  Mr. Obenchain has severe diffuse three-vessel coronary disease  with small diffusely disease distal vessel.  He has occlusion of his mid  LAD with a nongraftable distal vessel.  He has multiple other vessels  that are probably not graftable due to the diffuse nature of the  disease, especially in a morbidly obese diabetic who will have a heart  that is incased in fat.  He would also be a very high risk surgical  patient due to his morbid obesity, diabetes, history of heavy smoking,  and poor left ventricular function with elevated pulmonary pressures.  I  would not recommend coronary artery bypass graft surgery due to the  diffuse nature of his coronary disease, especially involving the distal  vessels.  I think the likelihood of being able to help him with bypass  surgery would be very slim and it would put him through a huge risk for  little benefit.  I discussed this with the patient and his family.  I  will reconsider whether any PCI would be possible to help him but this  may not be possible, and I think his longterm prognosis is going to be  poor.  He could also be  transferred to a [facility] for  evaluation where they may  consider attempt at high risk coronary bypass surgery with use of a left  ventricular support device and transplant back up if his heart continues  to fail, although with his comorbid factors, I do not know if anybody  would be willing to do that.      Evelene Croon, M.D.  Electronically Signed     BB/MEDQ  D:  10/23/2007  T:  10/24/2007  Job:  [id number]   cc:   Bevelyn Buckles. Bensimhon, MD

## 2011-01-10 ENCOUNTER — Ambulatory Visit: Payer: Medicare Other | Admitting: Internal Medicine

## 2011-01-17 ENCOUNTER — Encounter: Payer: Self-pay | Admitting: Internal Medicine

## 2011-01-22 ENCOUNTER — Other Ambulatory Visit (HOSPITAL_COMMUNITY): Payer: Self-pay | Admitting: Radiology

## 2011-01-22 DIAGNOSIS — I509 Heart failure, unspecified: Secondary | ICD-10-CM

## 2011-01-23 ENCOUNTER — Ambulatory Visit (HOSPITAL_COMMUNITY): Payer: Medicare Other | Attending: Internal Medicine | Admitting: Radiology

## 2011-01-23 DIAGNOSIS — E119 Type 2 diabetes mellitus without complications: Secondary | ICD-10-CM | POA: Insufficient documentation

## 2011-01-23 DIAGNOSIS — I10 Essential (primary) hypertension: Secondary | ICD-10-CM | POA: Insufficient documentation

## 2011-01-23 DIAGNOSIS — I079 Rheumatic tricuspid valve disease, unspecified: Secondary | ICD-10-CM | POA: Insufficient documentation

## 2011-01-23 DIAGNOSIS — I509 Heart failure, unspecified: Secondary | ICD-10-CM | POA: Insufficient documentation

## 2011-01-23 DIAGNOSIS — E785 Hyperlipidemia, unspecified: Secondary | ICD-10-CM | POA: Insufficient documentation

## 2011-01-23 DIAGNOSIS — I059 Rheumatic mitral valve disease, unspecified: Secondary | ICD-10-CM | POA: Insufficient documentation

## 2011-01-25 ENCOUNTER — Ambulatory Visit: Payer: Medicare Other | Admitting: Internal Medicine

## 2011-01-25 DIAGNOSIS — Z0289 Encounter for other administrative examinations: Secondary | ICD-10-CM

## 2011-02-04 ENCOUNTER — Encounter: Payer: Self-pay | Admitting: Internal Medicine

## 2011-02-04 ENCOUNTER — Ambulatory Visit (INDEPENDENT_AMBULATORY_CARE_PROVIDER_SITE_OTHER): Payer: Medicare Other | Admitting: Internal Medicine

## 2011-02-04 VITALS — BP 128/82 | HR 82 | Resp 14 | Ht 74.0 in | Wt 297.0 lb

## 2011-02-04 DIAGNOSIS — I2510 Atherosclerotic heart disease of native coronary artery without angina pectoris: Secondary | ICD-10-CM

## 2011-02-04 DIAGNOSIS — E785 Hyperlipidemia, unspecified: Secondary | ICD-10-CM

## 2011-02-04 DIAGNOSIS — I5022 Chronic systolic (congestive) heart failure: Secondary | ICD-10-CM

## 2011-02-04 NOTE — Patient Instructions (Signed)
Your physician recommends that you return for a FASTING lipid profile: this week (bmet, liver, lipid)  Your physician has requested that you have an echocardiogram. Echocardiography is a painless test that uses sound waves to create images of your heart. It provides your doctor with information about the size and shape of your heart and how well your heart's chambers and valves are working. This procedure takes approximately one hour. There are no restrictions for this procedure.  NEEDS IN 3 MONTHS.  Your physician recommends that you schedule a follow-up appointment in: 3 months.

## 2011-02-04 NOTE — Assessment & Plan Note (Signed)
No evidence of ischemia. Continue current regimen. OK to proceed with foot surgery.

## 2011-02-04 NOTE — Assessment & Plan Note (Signed)
TGs markedly elevated in 4/12. Likely due to DM2. Will repeat lipids this week. Goal LDL < 70. Continue current regimen.

## 2011-02-04 NOTE — Progress Notes (Signed)
HPI:  [name] Foley is a 35 year old male with a history of morbid obesity, hypertension, diabetes, and congestive heart failure secondary to severe ischemic cardiomyopathy with ejection fraction of 20% previously.  Cardiac catheterization 2009 year showed severe three- vessel coronary artery disease which was not amenable to bypass surgery due to severe distal disease.  He did undergo angioplasty of a portion of hisLAD.  Echo in 4/11 showed EF 40%. Echo 6/12: EF 30%  He returns for followup.  From a cardiac standpoint doing very well. Denies any CP or dyspnea. Compliant with meds. No orthopnea, PND or edema. Tries to walk every day. Went to [employer] and walked all day on Saturday. Mild SOB if he goes up steps. Feels like he is normal. Has lost another 5 pounds. Due for surgery on his foot next month.   Last lipids in 4/12  TC 190 TG 541 HDL 31 LDL 100  Taking Lipitor and fish oil. Couldn't afford Crestor.    ROS: All systems negative except as listed in HPI, PMH and Problem List.  Past Medical History  Diagnosis Date  . CHF (congestive heart failure)     Secondary to ischemic cardiomyopathy  . CAD (coronary artery disease)     VERY Severe  . Morbid obesity   . Type II or unspecified type diabetes mellitus without mention of complication, not stated as uncontrolled   . Hyperlipidemia   . GERD (gastroesophageal reflux disease)     Current Outpatient Prescriptions  Medication Sig Dispense Refill  . aspirin 325 MG tablet Take 325 mg by mouth daily.        Marland Kitchen atorvastatin (LIPITOR) 80 MG tablet Take 80 mg by mouth daily.        . carvedilol (COREG) 25 MG tablet 2 TABS PO BID      . clopidogrel (PLAVIX) 75 MG tablet Take 75 mg by mouth daily.        . colesevelam (WELCHOL) 625 MG tablet Take 3 tablets (1,875 mg total) by mouth 2 (two) times daily with a meal.  180 tablet  11  . digoxin (LANOXIN) 0.25 MG tablet Take 250 mcg by mouth daily.        Marland Kitchen esomeprazole (NEXIUM) 40 MG  capsule Take 1 capsule (40 mg total) by mouth daily.  30 capsule  0  . fish oil-omega-3 fatty acids 1000 MG capsule Take 2 g by mouth daily.        . furosemide (LASIX) 40 MG tablet Take 40 mg by mouth 2 (two) times daily.        Marland Kitchen glimepiride (AMARYL) 4 MG tablet Take 4 mg by mouth daily.        Marland Kitchen glucose blood test strip 1 each 2 (two) times daily. Use as instructed       . HYDROcodone-acetaminophen (LORTAB) 7.5-500 MG per tablet Take 1 tablet by mouth every 6 (six) hours as needed for pain.  65 tablet  5  . insulin detemir (LEVEMIR FLEXPEN) 100 UNIT/ML injection Inject 50 Units into the skin at bedtime.        . Linagliptin 5 MG TABS Take 1 tablet by mouth daily.  30 tablet  11  . omega-3 acid ethyl esters (LOVAZA) 1 G capsule Take 2 capsules (2 g total) by mouth 2 (two) times daily.  120 capsule  11  . pioglitazone (ACTOS) 30 MG tablet Take 1 tablet (30 mg total) by mouth daily.  30 tablet  11  . potassium chloride SA (  K-DUR,KLOR-CON) 20 MEQ tablet Take 20 mEq by mouth daily.        . ramipril (ALTACE) 10 MG capsule Take 10 mg by mouth daily.        Marland Kitchen spironolactone (ALDACTONE) 25 MG tablet Take 12.5 mg by mouth 2 (two) times daily.        Marland Kitchen DISCONTD: celecoxib (CELEBREX) 200 MG capsule Take 200 mg by mouth daily as needed.        Marland Kitchen DISCONTD: desvenlafaxine (PRISTIQ) 50 MG 24 hr tablet Take 50 mg by mouth daily.        Marland Kitchen DISCONTD: fexofenadine (ALLEGRA) 180 MG tablet Take 180 mg by mouth as needed.        Marland Kitchen DISCONTD: lansoprazole (PREVACID) 15 MG capsule Take 15 mg by mouth daily.        Marland Kitchen DISCONTD: traMADol (ULTRAM) 50 MG tablet Take 50 mg by mouth 4 (four) times daily as needed.           PHYSICAL EXAM: Filed Vitals:   02/04/11 1232  BP: 128/82  Pulse: 82  Resp: 14   General:  well appearing. no resp difficulty HEENT: normal Neck: supple. no JVD. Carotids 2+ bilat; no bruits. No lymphadenopathy or thryomegaly appreciated. Cor: PMI nondisplaced. Regular rate & rhythm. No rubs,  gallops, murmur. Lungs: clear Abdomen: obese soft, nontender, nondistended. No hepatosplenomegaly. No bruits or masses. Good bowel sounds. Extremities: no cyanosis, clubbing, rash, edema Neuro: alert & orientedx3, cranial nerves grossly intact. moves all 4 extremities w/o difficulty. affect normal    ECG: NSR 82 IVCD (atypical LBBB) No ST-T wave abnormalities.     ASSESSMENT & PLAN:

## 2011-02-04 NOTE — Assessment & Plan Note (Signed)
Doing well. NYHA I-II. Volume status looks good. EF down slightly. We discussed ICD but he would like to defer for now as his Dad has one and he saw him get shocked. We will repeat echo in 3 months.

## 2011-02-08 ENCOUNTER — Other Ambulatory Visit: Payer: Medicare Other | Admitting: *Deleted

## 2011-03-12 ENCOUNTER — Encounter: Payer: Self-pay | Admitting: Internal Medicine

## 2011-03-12 ENCOUNTER — Other Ambulatory Visit: Payer: Self-pay | Admitting: Internal Medicine

## 2011-03-12 ENCOUNTER — Ambulatory Visit (INDEPENDENT_AMBULATORY_CARE_PROVIDER_SITE_OTHER): Payer: Medicare Other | Admitting: Internal Medicine

## 2011-03-12 ENCOUNTER — Other Ambulatory Visit (INDEPENDENT_AMBULATORY_CARE_PROVIDER_SITE_OTHER): Payer: Medicare Other

## 2011-03-12 DIAGNOSIS — E119 Type 2 diabetes mellitus without complications: Secondary | ICD-10-CM

## 2011-03-12 DIAGNOSIS — I10 Essential (primary) hypertension: Secondary | ICD-10-CM

## 2011-03-12 DIAGNOSIS — E785 Hyperlipidemia, unspecified: Secondary | ICD-10-CM

## 2011-03-12 LAB — COMPREHENSIVE METABOLIC PANEL
ALT: 16 U/L (ref 0–53)
AST: 13 U/L (ref 0–37)
Albumin: 4 g/dL (ref 3.5–5.2)
Alkaline Phosphatase: 88 U/L (ref 39–117)
Calcium: 9.5 mg/dL (ref 8.4–10.5)
Chloride: 105 mEq/L (ref 96–112)
Potassium: 3.9 mEq/L (ref 3.5–5.1)
Sodium: 138 mEq/L (ref 135–145)
Total Protein: 7.7 g/dL (ref 6.0–8.3)

## 2011-03-12 LAB — LIPID PANEL
Total CHOL/HDL Ratio: 5
VLDL: 57.8 mg/dL — ABNORMAL HIGH (ref 0.0–40.0)

## 2011-03-12 NOTE — Patient Instructions (Signed)
Diabetes, Type 2 Diabetes is a lasting (chronic) disease. In type 2 diabetes, the pancreas does not make enough insulin (a hormone), and the body does not respond normally to the insulin that is made. This type of diabetes was also previously called adult onset diabetes. About 90% of all those who have diabetes have type 2. It usually occurs after the age of 40 but can occur at any age. CAUSES Unlike type 1 diabetes, which happens because insulin is no longer being made, type 2 diabetes happens because the body is making less insulin and has trouble using the insulin properly. SYMPTOMS  Drinking more than usual.   Urinating more than usual.   Blurred vision.   Dry, itchy skin.   Frequent infection like yeast infections in women.   More tired than usual (fatigue).  TREATMENT  Healthy eating.   Exercise.   Medication, if needed.   Monitoring blood glucose (sugar).   Seeing your caregiver regularly.  HOME CARE INSTRUCTIONS  Check your blood glucose (sugar) at least once daily. More frequent monitoring may be necessary, depending on your medications and on how well your diabetes is controlled. Your caregiver will advise you.   Take your medicine as directed by your caregiver.   Do not smoke.   Make wise food choices. Ask your caregiver for information. Weight loss can improve your diabetes.   Learn about low blood glucose (hypoglycemia) and how to treat it.   Get your eyes checked regularly.   Have a yearly physical exam. Have your blood pressure checked. Get your blood and urine tested.   Wear a pendant or bracelet saying that you have diabetes.   Check your feet every night for sores. Let your caregiver know if you have sores that are not healing.  SEEK MEDICAL CARE IF:  You are having problems keeping your blood glucose at target range.   You feel you might be having problems with your medicines.   You have symptoms of an illness that is not improving after 24  hours.   You have a sore or wound that is not healing.   You notice a change in vision or a new problem with your vision.   You develop a fever of more than 100.5.  Document Released: 08/05/2005 Document Re-Released: 08/27/2009 [employer] Patient Information 2011 [employer]. 

## 2011-03-12 NOTE — Progress Notes (Signed)
Subjective:    Patient ID: [name] Foley, male    DOB: 10/14/1975, 35 y.o.   MRN: [id number]  Diabetes He presents for his follow-up diabetic visit. He has type 2 diabetes mellitus. The initial diagnosis of diabetes was made 5 years ago. His disease course has been worsening. There are no hypoglycemic associated symptoms. Pertinent negatives for hypoglycemia include no dizziness, headaches, pallor, seizures, speech difficulty or tremors. Associated symptoms include blurred vision. Pertinent negatives for diabetes include no chest pain, no fatigue, no foot paresthesias, no foot ulcerations, no polydipsia, no polyphagia, no polyuria, no visual change, no weakness and no weight loss. There are no hypoglycemic complications. Symptoms are stable. There are no diabetic complications. Current diabetic treatment includes intensive insulin program and oral agent (triple therapy). He is compliant with treatment some of the time. His weight is stable. He is following a generally unhealthy diet. When asked about meal planning, he reported none. He has had a previous visit with a dietician. He never participates in exercise. His home blood glucose trend is increasing steadily. An ACE inhibitor/angiotensin II receptor blocker is being taken. He sees a podiatrist.Eye exam is current.      Review of Systems  Constitutional: Negative for fever, chills, weight loss, diaphoresis, activity change, appetite change, fatigue and unexpected weight change.  HENT: Negative for sore throat, facial swelling, trouble swallowing, neck pain, neck stiffness and voice change.   Eyes: Positive for blurred vision. Negative for photophobia, redness and visual disturbance.  Respiratory: Negative for apnea, cough, choking, chest tightness, shortness of breath, wheezing and stridor.   Cardiovascular: Negative for chest pain.  Gastrointestinal: Negative for nausea, vomiting, abdominal pain, diarrhea, constipation and anal bleeding.    Genitourinary: Negative for dysuria, urgency, polyuria, frequency, hematuria, flank pain, decreased urine volume, enuresis and difficulty urinating.  Musculoskeletal: Negative for myalgias, back pain, joint swelling, arthralgias and gait problem.  Skin: Negative for color change, pallor, rash and wound.  Neurological: Negative for dizziness, tremors, seizures, syncope, facial asymmetry, speech difficulty, weakness, light-headedness, numbness and headaches.  Hematological: Negative for polydipsia, polyphagia and adenopathy. Does not bruise/bleed easily.  Psychiatric/Behavioral: Negative.        Objective:   Physical Exam  Vitals reviewed. Constitutional: He is oriented to person, place, and time. He appears well-developed and well-nourished. No distress.  HENT:  Head: Normocephalic and atraumatic.  Right Ear: External ear normal.  Left Ear: External ear normal.  Nose: Nose normal.  Mouth/Throat: Oropharynx is clear and moist. No oropharyngeal exudate.  Eyes: Conjunctivae and EOM are normal. Pupils are equal, round, and reactive to light. Right eye exhibits no discharge. Left eye exhibits no discharge. No scleral icterus.  Neck: Normal range of motion. Neck supple. No JVD present. No tracheal deviation present. No thyromegaly present.  Cardiovascular: Normal rate, regular rhythm, normal heart sounds and intact distal pulses.  Exam reveals no gallop and no friction rub.   No murmur heard. Pulmonary/Chest: Effort normal and breath sounds normal. No stridor. No respiratory distress. He has no wheezes. He has no rales. He exhibits no tenderness.  Abdominal: Soft. Bowel sounds are normal. He exhibits no distension and no mass. There is no tenderness. There is no rebound and no guarding.  Musculoskeletal: Normal range of motion. He exhibits no edema and no tenderness.  Lymphadenopathy:    He has no cervical adenopathy.  Neurological: He is alert and oriented to person, place, and time. He has  normal reflexes. He displays normal reflexes. No cranial nerve deficit.  He exhibits normal muscle tone. Coordination normal.  Skin: Skin is warm and dry. No rash noted. He is not diaphoretic. No erythema. No pallor.  Psychiatric: He has a normal mood and affect. His behavior is normal. Judgment and thought content normal.        Lab Results  Component Value Date   WBC 7.5 12/13/2010   HGB 17.7* 12/13/2010   HCT 50.1 12/13/2010   PLT 206.0 12/13/2010   CHOL 190 12/13/2010   TRIG 541.0* 12/13/2010   HDL 31.70* 12/13/2010   LDLDIRECT 99.9 12/13/2010   ALT 23 12/13/2010   AST 20 12/13/2010   NA 136 12/13/2010   K 4.3 12/13/2010   CL 98 12/13/2010   CREATININE 0.7 12/13/2010   BUN 11 12/13/2010   CO2 27 12/13/2010   TSH 0.93 12/13/2010   INR 1.0 RATIO 11/12/2007   HGBA1C 11.4* 12/13/2010    Assessment & Plan:

## 2011-03-13 ENCOUNTER — Encounter: Payer: Self-pay | Admitting: Internal Medicine

## 2011-03-13 NOTE — Assessment & Plan Note (Signed)
Check his FLP today and continue current meds

## 2011-03-13 NOTE — Assessment & Plan Note (Signed)
His BP is well controlled 

## 2011-03-13 NOTE — Assessment & Plan Note (Signed)
He has been out of levemir for several weeks because he could not afford it, I gave him samples today and we started the application for pt assistance

## 2011-05-03 ENCOUNTER — Ambulatory Visit (HOSPITAL_COMMUNITY): Payer: Medicare Other | Attending: Internal Medicine | Admitting: Radiology

## 2011-05-03 DIAGNOSIS — E669 Obesity, unspecified: Secondary | ICD-10-CM | POA: Insufficient documentation

## 2011-05-03 DIAGNOSIS — E785 Hyperlipidemia, unspecified: Secondary | ICD-10-CM | POA: Insufficient documentation

## 2011-05-03 DIAGNOSIS — I509 Heart failure, unspecified: Secondary | ICD-10-CM

## 2011-05-03 DIAGNOSIS — I059 Rheumatic mitral valve disease, unspecified: Secondary | ICD-10-CM | POA: Insufficient documentation

## 2011-05-03 DIAGNOSIS — I079 Rheumatic tricuspid valve disease, unspecified: Secondary | ICD-10-CM | POA: Insufficient documentation

## 2011-05-03 DIAGNOSIS — E119 Type 2 diabetes mellitus without complications: Secondary | ICD-10-CM | POA: Insufficient documentation

## 2011-05-03 DIAGNOSIS — I10 Essential (primary) hypertension: Secondary | ICD-10-CM | POA: Insufficient documentation

## 2011-05-03 DIAGNOSIS — I5022 Chronic systolic (congestive) heart failure: Secondary | ICD-10-CM

## 2011-05-07 ENCOUNTER — Telehealth: Payer: Self-pay | Admitting: Internal Medicine

## 2011-05-07 NOTE — Telephone Encounter (Signed)
Gave pt echo results.

## 2011-05-07 NOTE — Telephone Encounter (Signed)
Pt returning call. Please call back.

## 2011-05-07 NOTE — Telephone Encounter (Signed)
Pt states he was called by Herbert Seta in the [facility] and is returning the call.

## 2011-05-09 LAB — CK TOTAL AND CKMB (NOT AT ARMC)
CK, MB: 2.2
Total CK: 61
Total CK: 66

## 2011-05-09 LAB — I-STAT 8, (EC8 V) (CONVERTED LAB)
Acid-base deficit: 1
BUN: 11
Bicarbonate: 23.4
Chloride: 108
pCO2, Ven: 37 — ABNORMAL LOW
pH, Ven: 7.408 — ABNORMAL HIGH

## 2011-05-09 LAB — POCT I-STAT CREATININE
Creatinine, Ser: 0.8
Operator id: 261381

## 2011-05-09 LAB — COMPREHENSIVE METABOLIC PANEL
ALT: 25
Alkaline Phosphatase: 72
Chloride: 104
Glucose, Bld: 249 — ABNORMAL HIGH
Potassium: 4.3
Sodium: 137
Total Bilirubin: 1.1
Total Protein: 6.9

## 2011-05-09 LAB — CBC
HCT: 45.3
HCT: 48.3
Hemoglobin: 15.9
Hemoglobin: 16.8
RBC: 5.08
RDW: 12.7

## 2011-05-09 LAB — DIFFERENTIAL
Lymphocytes Relative: 21
Lymphs Abs: 1.8
Monocytes Absolute: 0.6
Monocytes Relative: 7
Neutro Abs: 6.1

## 2011-05-09 LAB — URINALYSIS, ROUTINE W REFLEX MICROSCOPIC
Glucose, UA: 500 — AB
Ketones, ur: NEGATIVE
Leukocytes, UA: NEGATIVE
Nitrite: NEGATIVE
Protein, ur: >300 — AB
Urobilinogen, UA: 1

## 2011-05-09 LAB — TROPONIN I
Troponin I: 0.22 — ABNORMAL HIGH
Troponin I: 0.23 — ABNORMAL HIGH

## 2011-05-09 LAB — CARDIAC PANEL(CRET KIN+CKTOT+MB+TROPI)
CK, MB: 2.2
Troponin I: 0.21 — ABNORMAL HIGH

## 2011-05-09 LAB — AMYLASE: Amylase: 63

## 2011-05-09 LAB — URINE MICROSCOPIC-ADD ON

## 2011-05-09 LAB — B-NATRIURETIC PEPTIDE (CONVERTED LAB): Pro B Natriuretic peptide (BNP): 362 — ABNORMAL HIGH

## 2011-05-09 LAB — D-DIMER, QUANTITATIVE: D-Dimer, Quant: 1.05 — ABNORMAL HIGH

## 2011-05-13 LAB — TSH: TSH: 0.892

## 2011-05-13 LAB — CBC
HCT: 48.6
Hemoglobin: 17
MCHC: 34.8
MCHC: 34.8
MCV: 92.8
Platelets: 181
Platelets: 191
Platelets: 196
RDW: 12.3
RDW: 12.5
RDW: 12.7
WBC: 9.5

## 2011-05-13 LAB — LIPID PANEL
HDL: 23 — ABNORMAL LOW
LDL Cholesterol: 165 — ABNORMAL HIGH
Triglycerides: 259 — ABNORMAL HIGH

## 2011-05-13 LAB — POCT I-STAT 3, ART BLOOD GAS (G3+)
Bicarbonate: 23.6
TCO2: 25
pCO2 arterial: 37
pH, Arterial: 7.412

## 2011-05-13 LAB — COMPREHENSIVE METABOLIC PANEL
ALT: 31
Albumin: 3.5
Alkaline Phosphatase: 67
BUN: 12
Calcium: 9.4
Potassium: 3.6
Sodium: 137
Total Protein: 6.8

## 2011-05-13 LAB — MAGNESIUM: Magnesium: 1.8

## 2011-05-13 LAB — POCT I-STAT 3, VENOUS BLOOD GAS (G3P V)
Acid-base deficit: 1
Bicarbonate: 24.1 — ABNORMAL HIGH
Bicarbonate: 25.9 — ABNORMAL HIGH
O2 Saturation: 69
TCO2: 25
TCO2: 27
pCO2, Ven: 45.9
pH, Ven: 7.36 — ABNORMAL HIGH
pO2, Ven: 38

## 2011-05-13 LAB — BASIC METABOLIC PANEL
BUN: 16
CO2: 25
Calcium: 9.4
Creatinine, Ser: 0.8
GFR calc non Af Amer: >60
Glucose, Bld: 208 — ABNORMAL HIGH
Sodium: 137

## 2011-05-13 LAB — B-NATRIURETIC PEPTIDE (CONVERTED LAB): Pro B Natriuretic peptide (BNP): 140 — ABNORMAL HIGH

## 2011-05-13 LAB — HEPARIN LEVEL (UNFRACTIONATED)
Heparin Unfractionated: 0.18 — ABNORMAL LOW
Heparin Unfractionated: 0.52

## 2011-05-14 ENCOUNTER — Ambulatory Visit (INDEPENDENT_AMBULATORY_CARE_PROVIDER_SITE_OTHER): Payer: Medicare Other | Admitting: Internal Medicine

## 2011-05-14 ENCOUNTER — Encounter: Payer: Self-pay | Admitting: Internal Medicine

## 2011-05-14 ENCOUNTER — Telehealth: Payer: Self-pay

## 2011-05-14 DIAGNOSIS — I5022 Chronic systolic (congestive) heart failure: Secondary | ICD-10-CM

## 2011-05-14 DIAGNOSIS — E785 Hyperlipidemia, unspecified: Secondary | ICD-10-CM

## 2011-05-14 LAB — CBC
HCT: 40.7
MCHC: 35.3
MCV: 92.9
Platelets: 168
WBC: 6.2

## 2011-05-14 LAB — CK TOTAL AND CKMB (NOT AT ARMC)
Relative Index: INVALID
Total CK: 63

## 2011-05-14 LAB — BASIC METABOLIC PANEL
BUN: 11
Calcium: 8.8
Chloride: 105
Creatinine, Ser: 0.75
GFR calc Af Amer: >60

## 2011-05-14 MED ORDER — SPIRONOLACTONE 25 MG PO TABS: 12.5000 mg | ORAL_TABLET | Freq: Two times a day (BID) | ORAL | Status: DC

## 2011-05-14 MED ORDER — CARVEDILOL 3.125 MG PO TABS: 3.1250 mg | ORAL_TABLET | Freq: Two times a day (BID) | ORAL | Status: DC

## 2011-05-14 MED ORDER — INSULIN DETEMIR 100 UNIT/ML ~~LOC~~ SOLN: 50.0000 [IU] | Freq: Every day | SUBCUTANEOUS | Status: DC

## 2011-05-14 MED ORDER — CARVEDILOL 3.125 MG PO TABS: 25.0000 mg | ORAL_TABLET | Freq: Two times a day (BID) | ORAL | Status: DC

## 2011-05-14 NOTE — Assessment & Plan Note (Addendum)
NYHA II-III Volume status mildly elevated. He has not been taking ACE inhibitor and Beta Blocker for at least 2 months. Will need to restart his medications. He now has additional insurance and plans to obtain ACE and Beta Blocker today. Will start Carvedilol 3.125 mg po bid, Lisinopril 10 mg po bid, stop ramipril. He is to take one extra Lasix 40 mg today.  He declines defibrillator. Lengthy discussion about medication compliance,  Low salt diet and weight measurements. He is instructed to weigh and record daily. Follow up in [facility] in 2 weeks.   Patient seen and examined with Tonye Becket, NP. We discussed all aspects of the encounter. I agree with the assessment and plan as stated above.

## 2011-05-14 NOTE — Telephone Encounter (Signed)
Pt request rx for insulin, completed walk in sheet and notified

## 2011-05-14 NOTE — Assessment & Plan Note (Addendum)
TGs remain elevated. He is currently out of Lipitor. Goal LDL < 70. Will provide samples of Lipitor.  Continue current regimen.

## 2011-05-14 NOTE — Patient Instructions (Signed)
Take an extra dose of Lasix today. Stop Ramipril Follow up in 2 weeks in [facility]. Refills for Carvedilol and Spironolactone have been sent to your pharmacy.

## 2011-05-14 NOTE — Progress Notes (Signed)
HPI:  [name] Foley is a 35 year old male with a history of morbid obesity, hypertension, diabetes, and congestive heart failure secondary to severe ischemic cardiomyopathy with ejection fraction of 20% previously.  Cardiac catheterization 2009 year showed severe three- vessel coronary artery disease which was not amenable to bypass surgery due to severe distal disease.  [name] Foley did undergo angioplasty of a portion of hisLAD.  Echo in 4/11 showed EF 40%. Echo 6/12: EF 30%  [name] Foley returns for followup.  Last lipids in 4/12  TC 190 TG 541 HDL 31 LDL 100  Taking Lipitor and fish oil. Couldn't afford Crestor. Echo 9/14 20-25%  Very frustrated due to medications.[name] Foley has not been taking ace or beta blocker prescriptions for 2 months - mostly due to cost. Says [name] Foley gets SOB with mild walking  And [name] Foley will stop and rest.  Denies CP SOB.   [name] Foley does not weigh at home but feels like [name] Foley is gaining weight. Poor appetite. Sleeps with 2 pillows.  [name] Foley can not afford insulin.  Does not follow low sodium diet. Had pizza last night.  Disabled for 3 years.   ROS: All systems negative except as listed in HPI, PMH and Problem List.  Past Medical History  Diagnosis Date  . CHF (congestive heart failure)     Secondary to ischemic cardiomyopathy  . CAD (coronary artery disease)     VERY Severe  . Morbid obesity   . Type II or unspecified type diabetes mellitus without mention of complication, not stated as uncontrolled   . Hyperlipidemia   . GERD (gastroesophageal reflux disease)     Current Outpatient Prescriptions  Medication Sig Dispense Refill  . aspirin 325 MG tablet Take 325 mg by mouth daily.        Marland Kitchen atorvastatin (LIPITOR) 80 MG tablet Take 80 mg by mouth daily.        . carvedilol (COREG) 25 MG tablet 2 TABS PO BID      . clopidogrel (PLAVIX) 75 MG tablet Take 75 mg by mouth daily.        . colesevelam (WELCHOL) 625 MG tablet Take 3 tablets (1,875 mg total) by mouth 2 (two) times daily with a meal.  180 tablet  11  .  digoxin (LANOXIN) 0.25 MG tablet Take 250 mcg by mouth daily.        Marland Kitchen esomeprazole (NEXIUM) 40 MG capsule Take 1 capsule (40 mg total) by mouth daily.  30 capsule  0  . fish oil-omega-3 fatty acids 1000 MG capsule Take 2 g by mouth daily.        . furosemide (LASIX) 40 MG tablet Take 40 mg by mouth 2 (two) times daily.        Marland Kitchen glimepiride (AMARYL) 4 MG tablet Take 4 mg by mouth daily.        Marland Kitchen glucose blood test strip 1 each 2 (two) times daily. Use as instructed       . HYDROcodone-acetaminophen (LORTAB) 7.5-500 MG per tablet Take 1 tablet by mouth every 6 (six) hours as needed for pain.  65 tablet  5  . insulin detemir (LEVEMIR FLEXPEN) 100 UNIT/ML injection Inject 50 Units into the skin at bedtime.        . Linagliptin 5 MG TABS Take 1 tablet by mouth daily.  30 tablet  11  . omega-3 acid ethyl esters (LOVAZA) 1 G capsule Take 2 capsules (2 g total) by mouth 2 (two) times daily.  120 capsule  11  . pioglitazone (  ACTOS) 30 MG tablet Take 1 tablet (30 mg total) by mouth daily.  30 tablet  11  . potassium chloride SA (K-DUR,KLOR-CON) 20 MEQ tablet Take 20 mEq by mouth daily.        . ramipril (ALTACE) 10 MG capsule Take 10 mg by mouth daily.        Marland Kitchen spironolactone (ALDACTONE) 25 MG tablet Take 12.5 mg by mouth 2 (two) times daily.           PHYSICAL EXAM: Filed Vitals:   05/14/11 1007  BP: 122/84  Pulse: 102   General:  well appearing. no resp difficulty HEENT: normal Neck: supple. no JVP 8-9. Carotids 2+ bilat; no bruits. No lymphadenopathy or thryomegaly appreciated. Cor: PMI nondisplaced. Regular rate & rhythm. No rubs, gallops, murmur. Lungs: clear Abdomen: obese soft, nontender, nondistended. No hepatosplenomegaly. No bruits or masses. Good bowel sounds. Extremities: no cyanosis, clubbing, rash, R and LLE 1+edema Neuro: alert & orientedx3, cranial nerves grossly intact. moves all 4 extremities w/o difficulty. affect normal    ECG: ST 102 LBBB     ASSESSMENT & PLAN:

## 2011-05-28 LAB — I-STAT 8, (EC8 V) (CONVERTED LAB)
Acid-base deficit: 2
HCT: 47
Operator id: 270111
TCO2: 24
pCO2, Ven: 39.7 — ABNORMAL LOW
pH, Ven: 7.376 — ABNORMAL HIGH

## 2011-05-28 LAB — POCT I-STAT CREATININE
Creatinine, Ser: 0.8
Operator id: 270111

## 2011-05-28 LAB — CBC
MCHC: 34.5
MCV: 94
Platelets: 259
RBC: 4.87
WBC: 9.2

## 2011-05-28 LAB — DIFFERENTIAL
Basophils Relative: 1
Eosinophils Absolute: 0.2
Lymphs Abs: 2.9
Neutro Abs: 5.4
Neutrophils Relative %: 59

## 2011-05-30 ENCOUNTER — Ambulatory Visit (HOSPITAL_COMMUNITY)
Admission: RE | Admit: 2011-05-30 | Discharge: 2011-05-30 | Disposition: A | Discharge status: Home / Self Care | Payer: Medicare Other | Source: Ambulatory Visit | Attending: Internal Medicine | Admitting: Internal Medicine

## 2011-05-30 VITALS — BP 142/92 | HR 98 | Wt 296.8 lb

## 2011-05-30 DIAGNOSIS — I2510 Atherosclerotic heart disease of native coronary artery without angina pectoris: Secondary | ICD-10-CM | POA: Insufficient documentation

## 2011-05-30 DIAGNOSIS — E782 Mixed hyperlipidemia: Secondary | ICD-10-CM | POA: Insufficient documentation

## 2011-05-30 DIAGNOSIS — E785 Hyperlipidemia, unspecified: Secondary | ICD-10-CM

## 2011-05-30 DIAGNOSIS — E119 Type 2 diabetes mellitus without complications: Secondary | ICD-10-CM | POA: Insufficient documentation

## 2011-05-30 DIAGNOSIS — I5022 Chronic systolic (congestive) heart failure: Secondary | ICD-10-CM | POA: Insufficient documentation

## 2011-05-30 MED ORDER — LISINOPRIL 10 MG PO TABS: 10.0000 mg | ORAL_TABLET | Freq: Two times a day (BID) | ORAL | Status: DC

## 2011-05-30 MED ORDER — COLESEVELAM HCL 625 MG PO TABS: 1875.0000 mg | ORAL_TABLET | Freq: Two times a day (BID) | ORAL | Status: DC

## 2011-05-30 MED ORDER — DIGOXIN 250 MCG PO TABS: 250.0000 ug | ORAL_TABLET | Freq: Every day | ORAL | Status: DC

## 2011-05-30 MED ORDER — FUROSEMIDE 40 MG PO TABS: 40.0000 mg | ORAL_TABLET | Freq: Two times a day (BID) | ORAL | Status: DC

## 2011-05-30 NOTE — Assessment & Plan Note (Addendum)
Volume status looks good.  NYHA II.  Will add back lisinopril 10 mg BID otherwise continue current medications.  Discussed need for medication compliance.  Not interested in ICD at this point. Will check BMET at next visit.   Patient seen and examined with Ulyess Blossom PA-C. We discussed all aspects of the encounter. I agree with the assessment and plan as stated above.

## 2011-05-30 NOTE — Assessment & Plan Note (Signed)
Stable, no ischemic symptoms. Continue current regimen. 

## 2011-05-30 NOTE — Patient Instructions (Signed)
Add lisinopril 10 mg twice a day to present medication.  Return in 3 weeks for follow up.  Call if any questions.

## 2011-05-30 NOTE — Progress Notes (Signed)
Encounter addended by: Noralee Space, RN on: 05/30/2011  3:41 PM<BR>     Documentation filed: Orders

## 2011-05-30 NOTE — Progress Notes (Signed)
HPI:  [name] Foley is a 35 year old male with a history of morbid obesity, hypertension, diabetes, and congestive heart failure secondary to severe ischemic cardiomyopathy with ejection fraction of 20% previously.  Cardiac catheterization 2009 year showed severe three- vessel coronary artery disease which was not amenable to bypass surgery due to severe distal disease.  He did undergo angioplasty of a portion of hisLAD.  Echo in 4/11 showed EF 40%. Echo 6/12: EF 30%  He returns for followup.  Last lipids in 4/12  TC 190 TG 541 HDL 31 LDL 100  Taking Lipitor and fish oil. Couldn't afford Crestor. Echo 9/14 20-25%  Pt had difficulty with obtaining medications for about 2 months due to cost.  Since last visit he has been able to get his ace and beta blocker and has been compliant with his meds.  He was given an extra dose of lasix last visit secondary to SOB.  This improved his symptoms and he has not had complaints since that time.   Returns for follow up today.  Says he feels a lot better.  Denies dyspnea, orthopnea or PND.  Weight is down, 293 at home.  No CP.  Appetite remains poor.  Trying to follow a low sodium diet.  Denies dizziness/syncope.      ROS: All systems negative except as listed in HPI, PMH and Problem List.  Past Medical History  Diagnosis Date  . CHF (congestive heart failure)     Secondary to ischemic cardiomyopathy  . CAD (coronary artery disease)     VERY Severe  . Morbid obesity   . Type II or unspecified type diabetes mellitus without mention of complication, not stated as uncontrolled   . Hyperlipidemia   . GERD (gastroesophageal reflux disease)     Current Outpatient Prescriptions  Medication Sig Dispense Refill  . aspirin 325 MG tablet Take 325 mg by mouth daily.        Marland Kitchen atorvastatin (LIPITOR) 80 MG tablet Take 80 mg by mouth daily.        . carvedilol (COREG) 3.125 MG tablet Take 3.125 mg by mouth 2 (two) times daily with a meal.        . clopidogrel (PLAVIX)  75 MG tablet Take 75 mg by mouth daily.        . digoxin (LANOXIN) 0.25 MG tablet Take 1 tablet (250 mcg total) by mouth daily.  30 tablet  11  . esomeprazole (NEXIUM) 40 MG capsule Take 1 capsule (40 mg total) by mouth daily.  30 capsule  0  . fish oil-omega-3 fatty acids 1000 MG capsule Take 2 g by mouth daily.        . furosemide (LASIX) 40 MG tablet Take 1 tablet (40 mg total) by mouth 2 (two) times daily.  60 tablet  6  . glimepiride (AMARYL) 4 MG tablet Take 4 mg by mouth daily.        Marland Kitchen glucose blood test strip 1 each 2 (two) times daily. Use as instructed       . HYDROcodone-acetaminophen (LORTAB) 7.5-500 MG per tablet Take 1 tablet by mouth every 6 (six) hours as needed for pain.  65 tablet  5  . insulin detemir (LEVEMIR FLEXPEN) 100 UNIT/ML injection Inject 50 Units into the skin at bedtime.  10 mL  11  . Linagliptin 5 MG TABS Take 1 tablet by mouth daily.  30 tablet  11  . potassium chloride SA (K-DUR,KLOR-CON) 20 MEQ tablet Take 20 mEq by mouth daily.        Marland Kitchen  spironolactone (ALDACTONE) 25 MG tablet Take 0.5 tablets (12.5 mg total) by mouth 2 (two) times daily.  30 tablet  11  . colesevelam (WELCHOL) 625 MG tablet Take 3 tablets (1,875 mg total) by mouth 2 (two) times daily with a meal.  180 tablet  11  . pioglitazone (ACTOS) 30 MG tablet Take 1 tablet (30 mg total) by mouth daily.  30 tablet  11     PHYSICAL EXAM: Filed Vitals:   05/30/11 1331  BP: 142/102  Pulse: 98  Wt. 296.75 lbs  General:  well appearing. no resp difficulty HEENT: normal Neck: supple. no JVP 8-9. Carotids 2+ bilat; no bruits. No lymphadenopathy or thryomegaly appreciated. Cor: PMI nondisplaced. Regular rate & rhythm. No rubs, gallops, murmur. Lungs: clear Abdomen: obese soft, nontender, nondistended. No hepatosplenomegaly. No bruits or masses. Good bowel sounds. Extremities: no cyanosis, clubbing, rash, R and LLE 1+edema Neuro: alert & orientedx3, cranial nerves grossly intact. moves all 4 extremities  w/o difficulty. affect normal     ASSESSMENT & PLAN:

## 2011-05-31 ENCOUNTER — Telehealth (HOSPITAL_COMMUNITY): Payer: Self-pay | Admitting: *Deleted

## 2011-05-31 DIAGNOSIS — E119 Type 2 diabetes mellitus without complications: Secondary | ICD-10-CM

## 2011-05-31 DIAGNOSIS — E785 Hyperlipidemia, unspecified: Secondary | ICD-10-CM

## 2011-05-31 MED ORDER — COLESEVELAM HCL 625 MG PO TABS: 1875.0000 mg | ORAL_TABLET | Freq: Two times a day (BID) | ORAL | Status: DC

## 2011-05-31 NOTE — Telephone Encounter (Signed)
[name] Foley called this am regarding his medications.  He says that only 3 meds were called in yesterday and he is missing welchol.  He would like for you to call it in and to call him to let him know that you have received his message.

## 2011-05-31 NOTE — Telephone Encounter (Signed)
Left mess that med was resent

## 2011-06-11 ENCOUNTER — Other Ambulatory Visit (INDEPENDENT_AMBULATORY_CARE_PROVIDER_SITE_OTHER): Payer: Medicare Other

## 2011-06-11 ENCOUNTER — Encounter: Payer: Self-pay | Admitting: Internal Medicine

## 2011-06-11 ENCOUNTER — Other Ambulatory Visit: Payer: Self-pay | Admitting: Internal Medicine

## 2011-06-11 ENCOUNTER — Ambulatory Visit (INDEPENDENT_AMBULATORY_CARE_PROVIDER_SITE_OTHER): Payer: Medicare Other | Admitting: Internal Medicine

## 2011-06-11 VITALS — BP 106/72 | HR 80 | Temp 97.1°F | Resp 16 | Wt 295.0 lb

## 2011-06-11 DIAGNOSIS — I10 Essential (primary) hypertension: Secondary | ICD-10-CM

## 2011-06-11 DIAGNOSIS — Z23 Encounter for immunization: Secondary | ICD-10-CM

## 2011-06-11 DIAGNOSIS — E785 Hyperlipidemia, unspecified: Secondary | ICD-10-CM

## 2011-06-11 DIAGNOSIS — E119 Type 2 diabetes mellitus without complications: Secondary | ICD-10-CM

## 2011-06-11 LAB — CBC WITH DIFFERENTIAL/PLATELET
Eosinophils Relative: 3.8 % (ref 0.0–5.0)
HCT: 48.2 % (ref 39.0–52.0)
Monocytes Relative: 7.7 % (ref 3.0–12.0)
Neutrophils Relative %: 63.1 % (ref 43.0–77.0)
Platelets: 195 10*3/uL (ref 150.0–400.0)
RBC: 5.18 Mil/uL (ref 4.22–5.81)
WBC: 8 10*3/uL (ref 4.5–10.5)

## 2011-06-11 LAB — LIPID PANEL
HDL: 33.2 mg/dL — ABNORMAL LOW (ref >39.00)
Total CHOL/HDL Ratio: 5
Triglycerides: 345 mg/dL — ABNORMAL HIGH (ref 0.0–149.0)

## 2011-06-11 LAB — COMPREHENSIVE METABOLIC PANEL
ALT: 17 U/L (ref 0–53)
Albumin: 3.8 g/dL (ref 3.5–5.2)
CO2: 28 mEq/L (ref 19–32)
Calcium: 9.1 mg/dL (ref 8.4–10.5)
Chloride: 99 mEq/L (ref 96–112)
GFR: 120.56 mL/min (ref >60.00)
Sodium: 135 mEq/L (ref 135–145)
Total Protein: 7 g/dL (ref 6.0–8.3)

## 2011-06-11 LAB — URINALYSIS, ROUTINE W REFLEX MICROSCOPIC
Ketones, ur: NEGATIVE
Urine Glucose: >=1000
Urobilinogen, UA: 0.2 (ref 0.0–1.0)

## 2011-06-11 LAB — HEMOGLOBIN A1C: Hgb A1c MFr Bld: 12.3 % — ABNORMAL HIGH (ref 4.6–6.5)

## 2011-06-11 NOTE — Patient Instructions (Signed)

## 2011-06-11 NOTE — Assessment & Plan Note (Addendum)
I will check his FLP and will monitor his liver function today

## 2011-06-11 NOTE — Assessment & Plan Note (Signed)
His BP is well controlled 

## 2011-06-11 NOTE — Assessment & Plan Note (Signed)
His blood sugar has been poorly controlled but he has made great strides in his lifestyle modifications, today I will check his a1c to see if he has met the goal of appx 7.5, I will also monitor his renal function

## 2011-06-11 NOTE — Progress Notes (Signed)
Subjective:    Patient ID: [name] Foley, male    DOB: [birth year], 35 y.o.   MRN: [id number]  Diabetes He presents for his follow-up diabetic visit. He has type 2 diabetes mellitus. His disease course has been stable. There are no hypoglycemic associated symptoms. Pertinent negatives for hypoglycemia include no dizziness, headaches, pallor, seizures, speech difficulty or tremors. Pertinent negatives for diabetes include no blurred vision, no chest pain, no fatigue, no foot paresthesias, no foot ulcerations, no polydipsia, no polyphagia, no polyuria, no visual change, no weakness and no weight loss. There are no hypoglycemic complications. Symptoms are stable. Diabetic complications include heart disease. Current diabetic treatment includes oral agent (triple therapy) and intensive insulin program. He is compliant with treatment all of the time. His weight is stable. He is following a generally healthy diet. Meal planning includes avoidance of concentrated sweets. He has not had a previous visit with a dietician. He never participates in exercise. There is no change in his home blood glucose trend. His lunch blood glucose range is generally 180-200 mg/dl. His dinner blood glucose range is generally 180-200 mg/dl. His highest blood glucose is >200 mg/dl. His overall blood glucose range is 180-200 mg/dl. An ACE inhibitor/angiotensin II receptor blocker is being taken. He does not see a podiatrist.Eye exam is current.      Review of Systems  Constitutional: Negative for fever, chills, weight loss, diaphoresis, activity change, appetite change, fatigue and unexpected weight change.  HENT: Negative.   Eyes: Negative.  Negative for blurred vision.  Respiratory: Negative for cough, choking, chest tightness, shortness of breath, wheezing and stridor.   Cardiovascular: Negative for chest pain, palpitations and leg swelling.  Gastrointestinal: Negative for nausea, vomiting, abdominal pain, diarrhea,  constipation and abdominal distention.  Genitourinary: Negative for dysuria, urgency, polyuria, frequency, hematuria, flank pain, decreased urine volume, enuresis and difficulty urinating.  Musculoskeletal: Negative for myalgias, back pain, joint swelling, arthralgias and gait problem.  Skin: Negative for color change, pallor, rash and wound.  Neurological: Negative for dizziness, tremors, seizures, syncope, facial asymmetry, speech difficulty, weakness, light-headedness, numbness and headaches.  Hematological: Negative for polydipsia, polyphagia and adenopathy. Does not bruise/bleed easily.  Psychiatric/Behavioral: Negative.        Objective:   Physical Exam  Vitals reviewed. Constitutional: He is oriented to person, place, and time. He appears well-developed and well-nourished. No distress.  HENT:  Head: Normocephalic and atraumatic.  Mouth/Throat: Oropharynx is clear and moist. No oropharyngeal exudate.  Eyes: Conjunctivae are normal. Right eye exhibits no discharge. Left eye exhibits no discharge. No scleral icterus.  Neck: Normal range of motion. Neck supple. No JVD present. No tracheal deviation present. No thyromegaly present.  Cardiovascular: Normal rate, regular rhythm, normal heart sounds and intact distal pulses.  Exam reveals no gallop and no friction rub.   No murmur heard. Pulmonary/Chest: Effort normal and breath sounds normal. No stridor. No respiratory distress. He has no wheezes. He has no rales. He exhibits no tenderness.  Abdominal: Soft. Bowel sounds are normal. He exhibits no distension and no mass. There is no tenderness. There is no rebound and no guarding.  Musculoskeletal: Normal range of motion. He exhibits no edema and no tenderness.  Lymphadenopathy:    He has no cervical adenopathy.  Neurological: He is oriented to person, place, and time. He displays normal reflexes. He exhibits normal muscle tone. Coordination normal.  Skin: Skin is warm and dry. No rash  noted. He is not diaphoretic. No erythema. No pallor.  Psychiatric: He  has a normal mood and affect. His behavior is normal. Judgment and thought content normal.      Lab Results  Component Value Date   WBC 7.5 12/13/2010   HGB 17.7* 12/13/2010   HCT 50.1 12/13/2010   PLT 206.0 12/13/2010   GLUCOSE 358* 03/12/2011   CHOL 192 03/12/2011   TRIG 289.0* 03/12/2011   HDL 36.00* 03/12/2011   LDLDIRECT 119.2 03/12/2011   LDLCALC  Value: 165        Total Cholesterol/HDL:CHD Risk Coronary Heart Disease Risk Table                     Men   Women  1/2 Average Risk   3.4   3.3* 10/24/2007   ALT 16 03/12/2011   AST 13 03/12/2011   NA 138 03/12/2011   K 3.9 03/12/2011   CL 105 03/12/2011   CREATININE 0.8 03/12/2011   BUN 12 03/12/2011   CO2 25 03/12/2011   TSH 0.98 03/12/2011   INR 1.0 RATIO 11/12/2007   HGBA1C 12.0* 03/12/2011      Assessment & Plan:

## 2011-06-12 ENCOUNTER — Encounter: Payer: Self-pay | Admitting: Internal Medicine

## 2011-06-13 ENCOUNTER — Encounter: Payer: Self-pay | Admitting: Internal Medicine

## 2011-06-16 NOTE — Progress Notes (Signed)
Patient seen and examined with Nicki Bradley PA-C. We discussed all aspects of the encounter. I agree with the assessment and plan as stated above.   

## 2011-06-17 ENCOUNTER — Ambulatory Visit (HOSPITAL_COMMUNITY)
Admission: RE | Admit: 2011-06-17 | Discharge: 2011-06-17 | Disposition: A | Discharge status: Home / Self Care | Payer: Medicare Other | Source: Ambulatory Visit | Attending: Internal Medicine | Admitting: Internal Medicine

## 2011-06-17 DIAGNOSIS — I5022 Chronic systolic (congestive) heart failure: Secondary | ICD-10-CM | POA: Insufficient documentation

## 2011-06-17 DIAGNOSIS — E782 Mixed hyperlipidemia: Secondary | ICD-10-CM | POA: Insufficient documentation

## 2011-06-17 DIAGNOSIS — E119 Type 2 diabetes mellitus without complications: Secondary | ICD-10-CM | POA: Insufficient documentation

## 2011-06-17 DIAGNOSIS — E785 Hyperlipidemia, unspecified: Secondary | ICD-10-CM

## 2011-06-17 DIAGNOSIS — I2510 Atherosclerotic heart disease of native coronary artery without angina pectoris: Secondary | ICD-10-CM | POA: Insufficient documentation

## 2011-06-17 MED ORDER — CARVEDILOL 3.125 MG PO TABS: 6.2500 mg | ORAL_TABLET | Freq: Two times a day (BID) | ORAL | Status: DC

## 2011-06-17 MED ORDER — ATORVASTATIN CALCIUM 80 MG PO TABS: 80.0000 mg | ORAL_TABLET | Freq: Every day | ORAL | Status: DC

## 2011-06-17 MED ORDER — COLESEVELAM HCL 625 MG PO TABS: 1875.0000 mg | ORAL_TABLET | Freq: Two times a day (BID) | ORAL | Status: DC

## 2011-06-17 NOTE — Assessment & Plan Note (Addendum)
NYHA II. Volume status stable. Tolerating all medications. Will increase carvedilol 6.25 mg bid. I congratulated him on medication compliance. Will follow up in 2-3 weeks for medication titration.   Patient seen and examined with Tonye Becket, NP. We discussed all aspects of the encounter. I agree with the assessment and plan as stated above. Doing much better. Will increase carvedilol. Continue close f/u.

## 2011-06-17 NOTE — Assessment & Plan Note (Signed)
Stable, no ischemic symptoms. Continue current regimen. 

## 2011-06-17 NOTE — Patient Instructions (Addendum)
Please continue to weigh and record daily  Increase Carvedilol 6.25 mg am and pm   Follow up in 1 month.

## 2011-06-17 NOTE — Progress Notes (Signed)
HPI:  [name] Foley is a 35 year old male with a history of morbid obesity, hypertension, diabetes, and congestive heart failure secondary to severe ischemic cardiomyopathy with ejection fraction of 20% previously.  Cardiac catheterization 2009 year showed severe three- vessel coronary artery disease which was not amenable to bypass surgery due to severe distal disease.  He did undergo angioplasty of a portion of hisLAD.  Echo in 4/11 showed EF 40%. Echo 6/12: EF 30%  He returns for followup.  Last lipids in 4/12  TC 190 TG 541 HDL 31 LDL 100  Taking Lipitor and fish oil. Couldn't afford Crestor. Echo 9/14 20-25%  He is here for follow up. He is taking all medications. He is recently obtained medical insurance. Weight at home 288-292. He has not required any extra lasix. Denies SOB/CP/PND. No dizziness/syncope.  Appetite poor. He is following low salt diet. He is following glucose and feels like it is better controlled, however hgb a1c is 12.3. 10/23 Creatinine 0.8 Potassium 4.3      ROS: All systems negative except as listed in HPI, PMH and Problem List.  Past Medical History  Diagnosis Date  . CHF (congestive heart failure)     Secondary to ischemic cardiomyopathy  . CAD (coronary artery disease)     VERY Severe  . Morbid obesity   . Type II or unspecified type diabetes mellitus without mention of complication, not stated as uncontrolled   . Hyperlipidemia   . GERD (gastroesophageal reflux disease)     Current Outpatient Prescriptions  Medication Sig Dispense Refill  . aspirin 325 MG tablet Take 325 mg by mouth daily.        Marland Kitchen atorvastatin (LIPITOR) 80 MG tablet Take 1 tablet (80 mg total) by mouth daily.  30 tablet  6  . carvedilol (COREG) 3.125 MG tablet Take 3.125 mg by mouth 2 (two) times daily with a meal.        . clopidogrel (PLAVIX) 75 MG tablet Take 75 mg by mouth daily.        . colesevelam (WELCHOL) 625 MG tablet Take 3 tablets (1,875 mg total) by mouth 2 (two) times daily  with a meal.  180 tablet  11  . digoxin (LANOXIN) 0.25 MG tablet Take 1 tablet (250 mcg total) by mouth daily.  30 tablet  11  . esomeprazole (NEXIUM) 40 MG capsule Take 1 capsule (40 mg total) by mouth daily.  30 capsule  0  . fish oil-omega-3 fatty acids 1000 MG capsule Take 2 g by mouth daily.        . furosemide (LASIX) 40 MG tablet Take 1 tablet (40 mg total) by mouth 2 (two) times daily.  60 tablet  6  . glimepiride (AMARYL) 4 MG tablet Take 4 mg by mouth daily.        Marland Kitchen glucose blood test strip 1 each 2 (two) times daily. Use as instructed       . HYDROcodone-acetaminophen (LORTAB) 7.5-500 MG per tablet Take 1 tablet by mouth every 6 (six) hours as needed for pain.  65 tablet  5  . insulin detemir (LEVEMIR FLEXPEN) 100 UNIT/ML injection Inject 50 Units into the skin at bedtime.  10 mL  11  . Linagliptin 5 MG TABS Take 1 tablet by mouth daily.  30 tablet  11  . lisinopril (PRINIVIL) 10 MG tablet Take 1 tablet (10 mg total) by mouth 2 (two) times daily.  60 tablet  6  . pioglitazone (ACTOS) 30 MG tablet Take  1 tablet (30 mg total) by mouth daily.  30 tablet  11  . potassium chloride SA (K-DUR,KLOR-CON) 20 MEQ tablet Take 20 mEq by mouth daily.        Marland Kitchen spironolactone (ALDACTONE) 25 MG tablet Take 0.5 tablets (12.5 mg total) by mouth 2 (two) times daily.  30 tablet  11     PHYSICAL EXAM Filed Vitals:   06/17/11 1137  BP: 96/58  Pulse: 88  Wt. 293 lbs  General:  well appearing. no resp difficulty HEENT: normal Neck: supple. no JVP 5-6. Carotids 2+ bilat; no bruits. No lymphadenopathy or thryomegaly appreciated. Cor: PMI nondisplaced. Regular rate & rhythm. No rubs, gallops, murmur. Lungs: clear Abdomen: obese soft, nontender, nondistended. No hepatosplenomegaly. No bruits or masses. Good bowel sounds. Extremities: no cyanosis, clubbing, rash, R and LLE  Trace edema Neuro: alert & orientedx3, cranial nerves grossly intact. moves all 4 extremities w/o difficulty. affect  normal     ASSESSMENT & PLAN:

## 2011-07-11 NOTE — Progress Notes (Signed)
Encounter addended by: Dolores Patty, MD on: 07/11/2011  9:16 PM<BR>     Documentation filed: Follow-up Section, LOS Section

## 2011-07-11 NOTE — Progress Notes (Signed)
Patient seen and examined with Amy Clegg, NP. We discussed all aspects of the encounter. I agree with the assessment and plan as stated above.   

## 2011-07-18 ENCOUNTER — Ambulatory Visit (HOSPITAL_COMMUNITY)
Admission: RE | Admit: 2011-07-18 | Discharge: 2011-07-18 | Disposition: A | Discharge status: Home / Self Care | Payer: Medicare Other | Source: Ambulatory Visit | Attending: Internal Medicine | Admitting: Internal Medicine

## 2011-07-18 VITALS — BP 126/82 | HR 85 | Wt 298.5 lb

## 2011-07-18 DIAGNOSIS — I5022 Chronic systolic (congestive) heart failure: Secondary | ICD-10-CM | POA: Insufficient documentation

## 2011-07-18 DIAGNOSIS — I2510 Atherosclerotic heart disease of native coronary artery without angina pectoris: Secondary | ICD-10-CM | POA: Insufficient documentation

## 2011-07-18 MED ORDER — CARVEDILOL 3.125 MG PO TABS: ORAL_TABLET | ORAL | Status: DC

## 2011-07-18 NOTE — Assessment & Plan Note (Addendum)
Doing OK. NYHA class II-III. Having trouble tolerating carvedilol increase. Will cut back to 3.125/6.25. Discussed use of sliding scale lasix and can double am lasix for 2 days if weight doesn't come back down in next day or two. Still refusing ICD. Will see back in 3-4 weeks to increase carvedilol back up. May need sleep study to further assess fatigue if not improved with cutting back b-blocker.  Total time spent = 30 mins with over 50% of that time dedicated to counseling and discussions described above with Dr. [name].

## 2011-07-18 NOTE — Assessment & Plan Note (Signed)
No evidence of ischemia. Continue current regimen.   

## 2011-07-18 NOTE — Progress Notes (Signed)
HPI:  [name] Foley is a 35 year old male with a history of morbid obesity, hypertension, diabetes, and congestive heart failure secondary to severe ischemic cardiomyopathy with ejection fraction of 20% previously.  Cardiac catheterization 2009 year showed severe three- vessel coronary artery disease which was not amenable to bypass surgery due to severe distal disease.  [name] Foley did undergo angioplasty of a portion of hisLAD.  Echo in 4/11 showed EF 40%. Echo 9/12: EF 20-25%   [name] Foley is here for follow up. [name] Foley is taking all medications. At last visit we increased carvedilol to 6.25 bid. Since that time has been very fatigues. Hard to get around much. No orthopnea, PND or dizziness.   Weight up about 5 pounds but says [name] Foley is eating a lot. [name] Foley has not required any extra lasix. Blood sugars running over 200.     Says [name] Foley sleeps poorly but says [name] Foley doesn't snore much.    ROS: All systems negative except as listed in HPI, PMH and Problem List.  Past Medical History  Diagnosis Date  . CHF (congestive heart failure)     Secondary to ischemic cardiomyopathy  . CAD (coronary artery disease)     VERY Severe  . Morbid obesity   . Type II or unspecified type diabetes mellitus without mention of complication, not stated as uncontrolled   . Hyperlipidemia   . GERD (gastroesophageal reflux disease)     Current Outpatient Prescriptions  Medication Sig Dispense Refill  . aspirin 325 MG tablet Take 325 mg by mouth daily.        Marland Kitchen atorvastatin (LIPITOR) 80 MG tablet Take 1 tablet (80 mg total) by mouth daily.  30 tablet  6  . carvedilol (COREG) 3.125 MG tablet Take 2 tablets (6.25 mg total) by mouth 2 (two) times daily with a meal.  60 tablet  6  . CHROMIUM ASPARTATE PO Take by mouth. 1 tablet twice daily.       . clopidogrel (PLAVIX) 75 MG tablet Take 75 mg by mouth daily.        . colesevelam (WELCHOL) 625 MG tablet Take 3 tablets (1,875 mg total) by mouth 2 (two) times daily with a meal.  180 tablet  11  . digoxin  (LANOXIN) 0.25 MG tablet Take 1 tablet (250 mcg total) by mouth daily.  30 tablet  11  . esomeprazole (NEXIUM) 40 MG capsule Take 1 capsule (40 mg total) by mouth daily.  30 capsule  0  . fish oil-omega-3 fatty acids 1000 MG capsule Take 2 g by mouth daily.        . furosemide (LASIX) 40 MG tablet Take 1 tablet (40 mg total) by mouth 2 (two) times daily.  60 tablet  6  . glimepiride (AMARYL) 4 MG tablet Take 4 mg by mouth daily.        Marland Kitchen glucose blood test strip 1 each 2 (two) times daily. Use as instructed       . HYDROcodone-acetaminophen (LORTAB) 7.5-500 MG per tablet Take 1 tablet by mouth every 6 (six) hours as needed for pain.  65 tablet  5  . insulin detemir (LEVEMIR FLEXPEN) 100 UNIT/ML injection Inject 50 Units into the skin at bedtime.  10 mL  11  . Linagliptin 5 MG TABS Take 1 tablet by mouth daily.  30 tablet  11  . lisinopril (PRINIVIL) 10 MG tablet Take 1 tablet (10 mg total) by mouth 2 (two) times daily.  60 tablet  6  . Multiple Vitamins-Minerals (MENS MULTI  VITAMIN & MINERAL PO) Take by mouth. 1 tablet daily.       . potassium chloride SA (K-DUR,KLOR-CON) 20 MEQ tablet Take 20 mEq by mouth daily.        Marland Kitchen spironolactone (ALDACTONE) 25 MG tablet Take 0.5 tablets (12.5 mg total) by mouth 2 (two) times daily.  30 tablet  11     PHYSICAL EXAM Filed Vitals:   07/18/11 1038  BP: 126/82  Pulse: 85  Wt. 293 lbs  General:  well appearing. no resp difficulty HEENT: normal Neck: supple. no JVP 5-6. Carotids 2+ bilat; no bruits. No lymphadenopathy or thryomegaly appreciated. Cor: PMI nondisplaced. Regular rate & rhythm. No rubs, gallops, murmur. Lungs: clear Abdomen: obese soft, nontender, nondistended. No hepatosplenomegaly. No bruits or masses. Good bowel sounds. Extremities: no cyanosis, clubbing, rash, R and LLE  Trace edema Neuro: alert & orientedx3, cranial nerves grossly intact. moves all 4 extremities w/o difficulty. affect normal     ASSESSMENT & PLAN:

## 2011-07-18 NOTE — Progress Notes (Signed)
Encounter addended by: Noralee Space, RN on: 07/18/2011 11:31 AM<BR>     Documentation filed: Patient Instructions Section, Orders

## 2011-07-18 NOTE — Patient Instructions (Signed)
Change Carvedilol (Coreg) to 1 tab in AM and 2 tabs in PM  Your physician recommends that you schedule a follow-up appointment in: 1 month

## 2011-07-25 ENCOUNTER — Other Ambulatory Visit: Payer: Self-pay | Admitting: Internal Medicine

## 2011-07-25 ENCOUNTER — Ambulatory Visit (INDEPENDENT_AMBULATORY_CARE_PROVIDER_SITE_OTHER): Payer: Medicare Other | Admitting: Internal Medicine

## 2011-07-25 ENCOUNTER — Encounter: Payer: Self-pay | Admitting: Internal Medicine

## 2011-07-25 ENCOUNTER — Other Ambulatory Visit (INDEPENDENT_AMBULATORY_CARE_PROVIDER_SITE_OTHER): Payer: Medicare Other

## 2011-07-25 DIAGNOSIS — I10 Essential (primary) hypertension: Secondary | ICD-10-CM

## 2011-07-25 DIAGNOSIS — E785 Hyperlipidemia, unspecified: Secondary | ICD-10-CM

## 2011-07-25 DIAGNOSIS — I2510 Atherosclerotic heart disease of native coronary artery without angina pectoris: Secondary | ICD-10-CM

## 2011-07-25 DIAGNOSIS — I5022 Chronic systolic (congestive) heart failure: Secondary | ICD-10-CM

## 2011-07-25 DIAGNOSIS — E119 Type 2 diabetes mellitus without complications: Secondary | ICD-10-CM

## 2011-07-25 LAB — LIPID PANEL
Cholesterol: 169 mg/dL (ref 0–200)
HDL: 34 mg/dL — ABNORMAL LOW
Total CHOL/HDL Ratio: 5
Triglycerides: 522 mg/dL — ABNORMAL HIGH (ref 0.0–149.0)
VLDL: 104.4 mg/dL — ABNORMAL HIGH (ref 0.0–40.0)

## 2011-07-25 LAB — CBC WITH DIFFERENTIAL/PLATELET
Basophils Absolute: 0 10*3/uL (ref 0.0–0.1)
HCT: 42.8 % (ref 39.0–52.0)
Lymphs Abs: 1.9 10*3/uL (ref 0.7–4.0)
Monocytes Absolute: 0.7 10*3/uL (ref 0.1–1.0)
Monocytes Relative: 7.4 % (ref 3.0–12.0)
Platelets: 191 10*3/uL (ref 150.0–400.0)
RDW: 13.3 % (ref 11.5–14.6)

## 2011-07-25 LAB — URINALYSIS, ROUTINE W REFLEX MICROSCOPIC
Specific Gravity, Urine: 1.015 (ref 1.000–1.030)
Total Protein, Urine: NEGATIVE
Urine Glucose: >=1000
pH: 6 (ref 5.0–8.0)

## 2011-07-25 LAB — COMPREHENSIVE METABOLIC PANEL
ALT: 24 U/L (ref 0–53)
AST: 19 U/L (ref 0–37)
Alkaline Phosphatase: 86 U/L (ref 39–117)
Chloride: 96 mEq/L (ref 96–112)
Creatinine, Ser: 0.8 mg/dL (ref 0.4–1.5)
Total Bilirubin: 0.8 mg/dL (ref 0.3–1.2)

## 2011-07-25 LAB — HEMOGLOBIN A1C: Hgb A1c MFr Bld: 12 % — ABNORMAL HIGH (ref 4.6–6.5)

## 2011-07-25 LAB — CK: Total CK: 66 U/L (ref 7–232)

## 2011-07-25 MED ORDER — INSULIN DETEMIR 100 UNIT/ML ~~LOC~~ SOLN: 100.0000 [IU] | Freq: Every day | SUBCUTANEOUS | Status: DC

## 2011-07-25 NOTE — Assessment & Plan Note (Signed)
His BP is low today and he has s/s of orthostasis so I have asked him to bring his lasix dose back down to BID instead of TID and I will check his lytes and renal function today

## 2011-07-25 NOTE — Assessment & Plan Note (Signed)
He has some aches and fatigue so I will check a CPK level on him and will also get a repeat of his FLP today

## 2011-07-25 NOTE — Assessment & Plan Note (Signed)
He has a normal volume status today, I will check his renal function and he will continue lasix at the BID dosage

## 2011-07-25 NOTE — Progress Notes (Signed)
Subjective:    Patient ID: [name] Foley, male    DOB: 1976/03/15, 35 y.o.   MRN: [id number]  HPI He returns for f/up and tells me that he had some fluid retention about 2 weeks ago so he went to see cardiology and was told to increase the dose of his lasix to TID which he did but a few days after doing that he started feeling weak, dizzy, and orthostatic. He also complains of feeling achy all over with nausea but no vomiting.  His blood sugars remain in the >200 range and he has some polyuria and polydipsia.   Review of Systems  Constitutional: Positive for unexpected weight change (mild weight loss). Negative for fever, chills, diaphoresis, activity change, appetite change and fatigue.  HENT: Negative.   Eyes: Negative.   Respiratory: Negative for cough, shortness of breath, wheezing and stridor.   Cardiovascular: Negative for chest pain, palpitations and leg swelling.  Gastrointestinal: Positive for nausea. Negative for vomiting, abdominal pain, diarrhea, constipation, blood in stool, abdominal distention, anal bleeding and rectal pain.  Genitourinary: Negative for dysuria, urgency, frequency, hematuria, flank pain, decreased urine volume, enuresis and difficulty urinating.  Musculoskeletal: Negative for myalgias, back pain, joint swelling, arthralgias and gait problem.  Skin: Negative for color change, pallor, rash and wound.  Neurological: Positive for dizziness and light-headedness. Negative for tremors, seizures, syncope, facial asymmetry, speech difficulty, weakness, numbness and headaches.  Hematological: Negative for adenopathy. Does not bruise/bleed easily.  Psychiatric/Behavioral: Negative.        Objective:   Physical Exam  Vitals reviewed. Constitutional: He is oriented to person, place, and time. He appears well-developed and well-nourished. No distress.  HENT:  Mouth/Throat: Oropharynx is clear and moist. No oropharyngeal exudate.  Eyes: Conjunctivae are normal.  Right eye exhibits no discharge. Left eye exhibits no discharge. No scleral icterus.  Neck: Normal range of motion. Neck supple. No JVD present. No tracheal deviation present. No thyromegaly present.  Cardiovascular: Normal rate, regular rhythm, normal heart sounds and intact distal pulses.  Exam reveals no gallop and no friction rub.   No murmur heard. Pulmonary/Chest: Effort normal and breath sounds normal. No stridor. No respiratory distress. He has no wheezes. He has no rales. He exhibits no tenderness.  Abdominal: Soft. Bowel sounds are normal. He exhibits no distension and no mass. There is no tenderness. There is no rebound and no guarding.  Musculoskeletal: Normal range of motion. He exhibits edema (trace edema in both legs). He exhibits no tenderness.  Lymphadenopathy:    He has no cervical adenopathy.  Neurological: He is oriented to person, place, and time.  Skin: Skin is warm and dry. No rash noted. He is not diaphoretic. No erythema. No pallor.  Psychiatric: He has a normal mood and affect. His behavior is normal. Judgment and thought content normal.      Lab Results  Component Value Date   WBC 8.0 06/11/2011   HGB 16.8 06/11/2011   HCT 48.2 06/11/2011   PLT 195.0 06/11/2011   GLUCOSE 341* 06/11/2011   CHOL 170 06/11/2011   TRIG 345.0* 06/11/2011   HDL 33.20* 06/11/2011   LDLDIRECT 103.1 06/11/2011   LDLCALC  Value: 165        Total Cholesterol/HDL:CHD Risk Coronary Heart Disease Risk Table                     Men   Women  1/2 Average Risk   3.4   3.3* 10/24/2007   ALT 17  06/11/2011   AST 14 06/11/2011   NA 135 06/11/2011   K 4.3 06/11/2011   CL 99 06/11/2011   CREATININE 0.8 06/11/2011   BUN 19 06/11/2011   CO2 28 06/11/2011   TSH 0.92 06/11/2011   INR 1.0 RATIO 11/12/2007   HGBA1C 12.3* 06/11/2011      Assessment & Plan:

## 2011-07-25 NOTE — Assessment & Plan Note (Signed)
I will increase his levemir dose to 100 units per day and he will continue the other agents, today I will check his a1c and will monitor his renal function

## 2011-07-25 NOTE — Patient Instructions (Signed)

## 2011-07-26 LAB — LDL CHOLESTEROL, DIRECT: Direct LDL: 68.6 mg/dL

## 2011-07-28 ENCOUNTER — Encounter: Payer: Self-pay | Admitting: Internal Medicine

## 2011-07-29 ENCOUNTER — Telehealth (HOSPITAL_COMMUNITY): Payer: Self-pay | Admitting: *Deleted

## 2011-07-29 NOTE — Telephone Encounter (Signed)
[name] Foley called this am, he is concerned about his recent neck and back pain.  He is also having blurred vision, and light headedness.  He saw another Dr last week and is confused by what he was told.  His b/p was 110/?.  He would like for you to call him back as soon as you can.

## 2011-07-30 NOTE — Telephone Encounter (Signed)
Spoke w/pt he states that he has been having severe headaches in the back of his head, down his neck to his back, and sometimes it's so severe it causes dizziness and nausea.  He states at last ov we cut back on his coreg and he felt much better, and these symptoms just started about a week ago, they are different then any thing he has felt before and he is scared.  He states he went and saw pcp Dr Yetta Barre for this and he thought pt was dehydrated, pt states he is not dehydrated and these symptoms are not from low BP, this is something different.  Pt states he is not having any SOB or CP, he will go to an Urgent Care for eval.

## 2011-08-23 ENCOUNTER — Telehealth (HOSPITAL_COMMUNITY): Payer: Self-pay | Admitting: *Deleted

## 2011-08-23 MED ORDER — CLOPIDOGREL BISULFATE 75 MG PO TABS: 75.0000 mg | ORAL_TABLET | Freq: Every day | ORAL | Status: DC

## 2011-08-23 NOTE — Telephone Encounter (Signed)
[name] Foley needs a refill called in for his generic plavix.  Thanks.

## 2011-08-23 NOTE — Telephone Encounter (Signed)
Plavix reordered as requested.

## 2011-08-26 ENCOUNTER — Encounter (HOSPITAL_COMMUNITY): Payer: Medicare Other

## 2011-08-27 ENCOUNTER — Telehealth: Payer: Self-pay

## 2011-08-27 MED ORDER — GLIMEPIRIDE 4 MG PO TABS: 4.0000 mg | ORAL_TABLET | Freq: Every day | ORAL | Status: DC

## 2011-08-27 NOTE — Telephone Encounter (Signed)
refill 

## 2011-09-06 ENCOUNTER — Ambulatory Visit (HOSPITAL_COMMUNITY): Payer: Medicare Other

## 2011-09-12 ENCOUNTER — Ambulatory Visit (HOSPITAL_COMMUNITY)
Admission: RE | Admit: 2011-09-12 | Discharge: 2011-09-12 | Disposition: A | Discharge status: Home / Self Care | Payer: BC Managed Care – PPO | Source: Ambulatory Visit | Attending: Internal Medicine | Admitting: Internal Medicine

## 2011-09-12 ENCOUNTER — Encounter (HOSPITAL_COMMUNITY): Payer: Self-pay

## 2011-09-12 VITALS — BP 122/70 | HR 90 | Wt 301.2 lb

## 2011-09-12 DIAGNOSIS — I5022 Chronic systolic (congestive) heart failure: Secondary | ICD-10-CM | POA: Insufficient documentation

## 2011-09-12 MED ORDER — LISINOPRIL 10 MG PO TABS: ORAL_TABLET | ORAL | Status: DC

## 2011-09-12 NOTE — Progress Notes (Signed)
Patient ID: [name] Foley, male   DOB: 23-Jul-1976, 36 y.o.   MRN: [id number]   HPI:  [name] Foley is a 36 year old male with a history of morbid obesity, hypertension, diabetes, and congestive heart failure secondary to severe ischemic cardiomyopathy with ejection fraction of 20% previously.  Cardiac catheterization 2009 year showed severe three- vessel coronary artery disease which was not amenable to bypass surgery due to severe distal disease.  He did undergo angioplasty of a portion of hisLAD.  Echo in 4/11 showed EF 40%. Echo 9/12: EF 20-25%   He is here for follow up. Feels good. Denies SOB/PND/Orthopnea. He is taking all medications. Last visit we decreased carvedilol to 3.125 mg in am an  6.25 in pm. He has not been taking any extra Lasix.  Weight at home 290-295. He has been following low salt diet. He is unable to exercise due to foot pain. Blood sugar high 100s-low 200s.         ROS: All systems negative except as listed in HPI, PMH and Problem List.  Past Medical History  Diagnosis Date  . CHF (congestive heart failure)     Secondary to ischemic cardiomyopathy  . CAD (coronary artery disease)     VERY Severe  . Morbid obesity   . Type II or unspecified type diabetes mellitus without mention of complication, not stated as uncontrolled   . Hyperlipidemia   . GERD (gastroesophageal reflux disease)     Current Outpatient Prescriptions  Medication Sig Dispense Refill  . aspirin 325 MG tablet Take 325 mg by mouth daily.        Marland Kitchen atorvastatin (LIPITOR) 80 MG tablet Take 1 tablet (80 mg total) by mouth daily.  30 tablet  6  . carvedilol (COREG) 3.125 MG tablet Take 1 tab in AM and 2 tabs in PM  60 tablet  6  . clopidogrel (PLAVIX) 75 MG tablet Take 1 tablet (75 mg total) by mouth daily.  30 tablet  3  . colesevelam (WELCHOL) 625 MG tablet Take 3 tablets (1,875 mg total) by mouth 2 (two) times daily with a meal.  180 tablet  11  . digoxin (LANOXIN) 0.25 MG tablet Take 1 tablet (250 mcg  total) by mouth daily.  30 tablet  11  . esomeprazole (NEXIUM) 40 MG capsule Take 1 capsule (40 mg total) by mouth daily.  30 capsule  0  . fish oil-omega-3 fatty acids 1000 MG capsule Take 2 g by mouth daily.        . furosemide (LASIX) 40 MG tablet Take 1 tablet (40 mg total) by mouth 2 (two) times daily.  60 tablet  6  . glimepiride (AMARYL) 4 MG tablet Take 1 tablet (4 mg total) by mouth daily.  30 tablet  11  . glucose blood test strip 1 each 2 (two) times daily. Use as instructed       . HYDROcodone-acetaminophen (LORTAB) 7.5-500 MG per tablet Take 1 tablet by mouth every 6 (six) hours as needed for pain.  65 tablet  5  . insulin detemir (LEVEMIR FLEXPEN) 100 UNIT/ML injection Inject 100 Units into the skin at bedtime.  10 mL  11  . Linagliptin 5 MG TABS Take 1 tablet by mouth daily.  30 tablet  11  . lisinopril (PRINIVIL) 10 MG tablet Take 1 tablet (10 mg total) by mouth 2 (two) times daily.  60 tablet  6  . Multiple Vitamins-Minerals (MENS MULTI VITAMIN & MINERAL PO) Take by mouth. 1 tablet  daily.       . potassium chloride SA (K-DUR,KLOR-CON) 20 MEQ tablet Take 20 mEq by mouth daily.        Marland Kitchen spironolactone (ALDACTONE) 25 MG tablet Take 0.5 tablets (12.5 mg total) by mouth 2 (two) times daily.  30 tablet  11  . CHROMIUM ASPARTATE PO Take by mouth. 1 tablet twice daily.          PHYSICAL EXAM Filed Vitals:   09/12/11 0917  BP: 122/70  Pulse: 90  Wt. 301 (298 lbs)  General:  well appearing. no resp difficulty HEENT: normal Neck: supple. no JVP 5-6. Carotids 2+ bilat; no bruits. No lymphadenopathy or thryomegaly appreciated. Cor: PMI nondisplaced. Regular rate & rhythm. No rubs, gallops, murmur. Lungs: clear Abdomen: obese soft, nontender, nondistended. No hepatosplenomegaly. No bruits or masses. Good bowel sounds. Extremities: no cyanosis, clubbing, rash, R and LLE  Trace edema Neuro: alert & orientedx3, cranial nerves grossly intact. moves all 4 extremities w/o difficulty.  affect normal     ASSESSMENT & PLAN:

## 2011-09-12 NOTE — Assessment & Plan Note (Addendum)
NYHA II. Volume status stable. Will increase Lisinopril 10 mg in am and 20 mg at night. Follow up in 3 months Reinforced need for daily weights and reviewed use of sliding scale diuretics.

## 2011-09-12 NOTE — Patient Instructions (Signed)
Take Lisinopril 10 mg in am and 20 mg in pm  Follow up in 3 months  Do the following things EVERYDAY: 1) Weigh yourself in the morning before breakfast. Write it down and keep it in a log. 2) Take your medicines as prescribed 3) Eat low salt foods-Limit salt (sodium) to 2000mg  per day.  4) Stay as active as you can everyday

## 2011-09-17 NOTE — Progress Notes (Signed)
Patient ID: [name] Foley, male   DOB: February 16, 1976, 36 y.o.   MRN: [id number] Patient ID: [name] Foley, male   DOB: November 12, 1975, 36 y.o.   MRN: [id number]   HPI:  [name] Foley is a 36 year old male with a history of morbid obesity, hypertension, diabetes, and congestive heart failure secondary to severe ischemic cardiomyopathy with ejection fraction of 20% previously.  Cardiac catheterization 2009 year showed severe three- vessel coronary artery disease which was not amenable to bypass surgery due to severe distal disease.  He did undergo angioplasty of a portion of hisLAD.  Echo in 4/11 showed EF 40%. Echo 9/12: EF 20-25%   He is here for follow up. Feels good. Denies SOB/PND/Orthopnea. He is taking all medications. Last visit we decreased carvedilol to 3.125 mg in am an  6.25 in pm. He has not been taking any extra Lasix.  Weight at home 290-295. He has been following low salt diet. He is unable to exercise due to foot pain. Blood sugar high 100s-low 200s.         ROS: All systems negative except as listed in HPI, PMH and Problem List.  Past Medical History  Diagnosis Date  . CHF (congestive heart failure)     Secondary to ischemic cardiomyopathy  . CAD (coronary artery disease)     VERY Severe  . Morbid obesity   . Type II or unspecified type diabetes mellitus without mention of complication, not stated as uncontrolled   . Hyperlipidemia   . GERD (gastroesophageal reflux disease)     Current Outpatient Prescriptions  Medication Sig Dispense Refill  . aspirin 325 MG tablet Take 325 mg by mouth daily.        Marland Kitchen atorvastatin (LIPITOR) 80 MG tablet Take 1 tablet (80 mg total) by mouth daily.  30 tablet  6  . carvedilol (COREG) 3.125 MG tablet Take 1 tab in AM and 2 tabs in PM  60 tablet  6  . clopidogrel (PLAVIX) 75 MG tablet Take 1 tablet (75 mg total) by mouth daily.  30 tablet  3  . colesevelam (WELCHOL) 625 MG tablet Take 3 tablets (1,875 mg total) by mouth 2 (two) times daily with a meal.   180 tablet  11  . digoxin (LANOXIN) 0.25 MG tablet Take 1 tablet (250 mcg total) by mouth daily.  30 tablet  11  . esomeprazole (NEXIUM) 40 MG capsule Take 1 capsule (40 mg total) by mouth daily.  30 capsule  0  . fish oil-omega-3 fatty acids 1000 MG capsule Take 2 g by mouth daily.        . furosemide (LASIX) 40 MG tablet Take 1 tablet (40 mg total) by mouth 2 (two) times daily.  60 tablet  6  . glimepiride (AMARYL) 4 MG tablet Take 1 tablet (4 mg total) by mouth daily.  30 tablet  11  . glucose blood test strip 1 each 2 (two) times daily. Use as instructed       . HYDROcodone-acetaminophen (LORTAB) 7.5-500 MG per tablet Take 1 tablet by mouth every 6 (six) hours as needed for pain.  65 tablet  5  . insulin detemir (LEVEMIR FLEXPEN) 100 UNIT/ML injection Inject 100 Units into the skin at bedtime.  10 mL  11  . Linagliptin 5 MG TABS Take 1 tablet by mouth daily.  30 tablet  11  . lisinopril (PRINIVIL) 10 MG tablet Take 10 mg in am and 20 mg in pm  90 tablet  6  .  Multiple Vitamins-Minerals (MENS MULTI VITAMIN & MINERAL PO) Take by mouth. 1 tablet daily.       . potassium chloride SA (K-DUR,KLOR-CON) 20 MEQ tablet Take 20 mEq by mouth daily.        Marland Kitchen spironolactone (ALDACTONE) 25 MG tablet Take 0.5 tablets (12.5 mg total) by mouth 2 (two) times daily.  30 tablet  11  . CHROMIUM ASPARTATE PO Take by mouth. 1 tablet twice daily.          PHYSICAL EXAM Filed Vitals:   09/12/11 0917  BP: 122/70  Pulse: 90  Wt. 301 (298 lbs)  General:  well appearing. no resp difficulty HEENT: normal Neck: supple. no JVP 5-6. Carotids 2+ bilat; no bruits. No lymphadenopathy or thryomegaly appreciated. Cor: PMI nondisplaced. Regular rate & rhythm. No rubs, gallops, murmur. Lungs: clear Abdomen: obese soft, nontender, nondistended. No hepatosplenomegaly. No bruits or masses. Good bowel sounds. Extremities: no cyanosis, clubbing, rash, R and LLE  Trace edema Neuro: alert & orientedx3, cranial nerves grossly  intact. moves all 4 extremities w/o difficulty. affect normal     ASSESSMENT & PLAN:

## 2011-09-21 NOTE — Progress Notes (Signed)
Patient seen and examined with Amy Clegg, NP. We discussed all aspects of the encounter. I agree with the assessment and plan as stated above.   

## 2011-10-15 ENCOUNTER — Ambulatory Visit: Payer: Medicare Other | Admitting: Internal Medicine

## 2011-10-15 DIAGNOSIS — Z0289 Encounter for other administrative examinations: Secondary | ICD-10-CM

## 2011-11-27 LAB — HM DIABETES EYE EXAM: HM Diabetic Eye Exam: NORMAL

## 2011-12-12 ENCOUNTER — Ambulatory Visit (HOSPITAL_COMMUNITY)
Admission: RE | Admit: 2011-12-12 | Discharge: 2011-12-12 | Disposition: A | Discharge status: Home / Self Care | Payer: BC Managed Care – PPO | Source: Ambulatory Visit | Attending: Internal Medicine | Admitting: Internal Medicine

## 2011-12-12 VITALS — BP 108/80 | HR 77 | Wt 292.2 lb

## 2011-12-12 DIAGNOSIS — K219 Gastro-esophageal reflux disease without esophagitis: Secondary | ICD-10-CM

## 2011-12-12 DIAGNOSIS — I5022 Chronic systolic (congestive) heart failure: Secondary | ICD-10-CM | POA: Insufficient documentation

## 2011-12-12 DIAGNOSIS — I2510 Atherosclerotic heart disease of native coronary artery without angina pectoris: Secondary | ICD-10-CM

## 2011-12-12 MED ORDER — CARVEDILOL 6.25 MG PO TABS: 6.2500 mg | ORAL_TABLET | Freq: Two times a day (BID) | ORAL | Status: DC

## 2011-12-12 MED ORDER — ESOMEPRAZOLE MAGNESIUM 40 MG PO CPDR: 40.0000 mg | DELAYED_RELEASE_CAPSULE | Freq: Every day | ORAL | Status: DC

## 2011-12-12 MED ORDER — PANTOPRAZOLE SODIUM 40 MG PO TBEC: 40.0000 mg | DELAYED_RELEASE_TABLET | Freq: Every day | ORAL | Status: DC

## 2011-12-12 NOTE — Assessment & Plan Note (Addendum)
Needs PPI. Given previous stent and use of plavix will start Protonix. (least interaction)

## 2011-12-12 NOTE — Assessment & Plan Note (Addendum)
Volume status looks good.  NYHA II-III, functional status limited by foot pain as well.Previously had to decrease b-blocker due to fatigue.  Will try to increase coreg 6.25 mg BID and see if he tolerates. He can decrease if needed.  Repeat echo in 2 months.  Will also start protonix for his reflux.

## 2011-12-12 NOTE — Progress Notes (Signed)
HPI:  [name] Foley is a 36 year old male with a history of morbid obesity, hypertension, diabetes, and congestive heart failure secondary to severe ischemic cardiomyopathy with ejection fraction of 20% previously.  Cardiac catheterization 2009 year showed severe three- vessel coronary artery disease which was not amenable to bypass surgery due to severe distal disease.  He did undergo angioplasty of a portion of his LAD.  Echo in 4/11 showed EF 40%. Echo 9/12: EF 20-25%  He returns for follow up today.  Feels pretty good today.  Dealing with heartburn and reflux, currently out of nexium.  Has occasional episodes of dizziness when standing.  No syncope.  Weight stable at home.  No extra lasix.  Denies SOB/PND/Orthopnea. He has been following low salt diet. Blood sugar high 100s-low 200s. Continues to have foot pain limiting activity.  No chest pain.     ROS: All systems negative except as listed in HPI, PMH and Problem List.  Past Medical History  Diagnosis Date  . CHF (congestive heart failure)     Secondary to ischemic cardiomyopathy  . CAD (coronary artery disease)     VERY Severe  . Morbid obesity   . Type II or unspecified type diabetes mellitus without mention of complication, not stated as uncontrolled   . Hyperlipidemia   . GERD (gastroesophageal reflux disease)     Current Outpatient Prescriptions  Medication Sig Dispense Refill  . aspirin 325 MG tablet Take 325 mg by mouth daily.        Marland Kitchen atorvastatin (LIPITOR) 80 MG tablet Take 1 tablet (80 mg total) by mouth daily.  30 tablet  6  . carvedilol (COREG) 3.125 MG tablet Take 1 tab in AM and 2 tabs in PM  60 tablet  6  . clopidogrel (PLAVIX) 75 MG tablet Take 1 tablet (75 mg total) by mouth daily.  30 tablet  3  . colesevelam (WELCHOL) 625 MG tablet Take 3 tablets (1,875 mg total) by mouth 2 (two) times daily with a meal.  180 tablet  11  . digoxin (LANOXIN) 0.25 MG tablet Take 1 tablet (250 mcg total) by mouth daily.  30 tablet  11  .  esomeprazole (NEXIUM) 40 MG capsule Take 1 capsule (40 mg total) by mouth daily.  30 capsule  3  . fish oil-omega-3 fatty acids 1000 MG capsule Take 2 g by mouth daily.        . furosemide (LASIX) 40 MG tablet Take 1 tablet (40 mg total) by mouth 2 (two) times daily.  60 tablet  6  . glimepiride (AMARYL) 4 MG tablet Take 1 tablet (4 mg total) by mouth daily.  30 tablet  11  . glucose blood test strip 1 each 2 (two) times daily. Use as instructed       . insulin detemir (LEVEMIR FLEXPEN) 100 UNIT/ML injection Inject 100 Units into the skin at bedtime.  10 mL  11  . Linagliptin 5 MG TABS Take 1 tablet by mouth daily.  30 tablet  11  . lisinopril (PRINIVIL) 10 MG tablet Take 10 mg in am and 20 mg in pm  90 tablet  6  . Multiple Vitamins-Minerals (MENS MULTI VITAMIN & MINERAL PO) Take by mouth. 1 tablet daily.       . potassium chloride SA (K-DUR,KLOR-CON) 20 MEQ tablet Take 20 mEq by mouth daily.        Marland Kitchen spironolactone (ALDACTONE) 25 MG tablet Take 0.5 tablets (12.5 mg total) by mouth 2 (two) times daily.  30 tablet  11  . DISCONTD: esomeprazole (NEXIUM) 40 MG capsule Take 1 capsule (40 mg total) by mouth daily.  30 capsule  0  . HYDROcodone-acetaminophen (LORTAB) 7.5-500 MG per tablet Take 1 tablet by mouth every 6 (six) hours as needed for pain.  65 tablet  5     PHYSICAL EXAM Filed Vitals:   12/12/11 0934  BP: 108/80  Pulse: 77  Weight: 292 lb 4 oz (132.564 kg)  SpO2: 100%   General:  well appearing. no resp difficulty HEENT: normal Neck: supple. no JVP flat. Carotids 2+ bilat; no bruits. No lymphadenopathy or thryomegaly appreciated. Cor: PMI nondisplaced. Regular rate & rhythm. No rubs, gallops, murmur. Lungs: clear Abdomen: obese soft, nontender, nondistended. No hepatosplenomegaly. No bruits or masses. Good bowel sounds. Extremities: no cyanosis, clubbing, rash, trace edema Neuro: alert & orientedx3, cranial nerves grossly intact. moves all 4 extremities w/o difficulty. affect  normal   ASSESSMENT & PLAN:

## 2011-12-12 NOTE — Assessment & Plan Note (Addendum)
No evidence of ischemia. Continue current regimen.   

## 2011-12-12 NOTE — Patient Instructions (Addendum)
Increase Carvedilol to 6.25 mg Twice daily  Start Protonix 40 mg daily  Your physician has requested that you have an echocardiogram. Echocardiography is a painless test that uses sound waves to create images of your heart. It provides your doctor with information about the size and shape of your heart and how well your heart's chambers and valves are working. This procedure takes approximately one hour. There are no restrictions for this procedure.  IN 2 MONTHS.  Your physician recommends that you schedule a follow-up appointment in: 2 months

## 2011-12-20 ENCOUNTER — Other Ambulatory Visit (HOSPITAL_COMMUNITY): Payer: Self-pay | Admitting: Internal Medicine

## 2011-12-20 ENCOUNTER — Other Ambulatory Visit (HOSPITAL_COMMUNITY): Payer: Self-pay | Admitting: Adult Health

## 2011-12-20 NOTE — Telephone Encounter (Signed)
Refilled lasix

## 2012-01-24 ENCOUNTER — Other Ambulatory Visit (HOSPITAL_COMMUNITY): Payer: Self-pay | Admitting: Internal Medicine

## 2012-02-13 ENCOUNTER — Encounter (HOSPITAL_COMMUNITY): Payer: Self-pay

## 2012-02-13 ENCOUNTER — Ambulatory Visit (HOSPITAL_COMMUNITY)
Admission: RE | Admit: 2012-02-13 | Discharge: 2012-02-13 | Disposition: A | Discharge status: Home / Self Care | Payer: BC Managed Care – PPO | Source: Ambulatory Visit | Attending: Internal Medicine | Admitting: Internal Medicine

## 2012-02-13 VITALS — BP 114/62 | HR 70 | Resp 18 | Ht 73.0 in | Wt 287.0 lb

## 2012-02-13 DIAGNOSIS — I5022 Chronic systolic (congestive) heart failure: Secondary | ICD-10-CM | POA: Insufficient documentation

## 2012-02-13 DIAGNOSIS — I509 Heart failure, unspecified: Secondary | ICD-10-CM | POA: Insufficient documentation

## 2012-02-13 DIAGNOSIS — K219 Gastro-esophageal reflux disease without esophagitis: Secondary | ICD-10-CM | POA: Insufficient documentation

## 2012-02-13 DIAGNOSIS — I517 Cardiomegaly: Secondary | ICD-10-CM

## 2012-02-13 DIAGNOSIS — I2510 Atherosclerotic heart disease of native coronary artery without angina pectoris: Secondary | ICD-10-CM | POA: Insufficient documentation

## 2012-02-13 MED ORDER — CARVEDILOL 3.125 MG PO TABS: ORAL_TABLET | ORAL | Status: DC

## 2012-02-13 NOTE — Patient Instructions (Addendum)
Increase Carvedilol to 1 tab in AM and 3 tabs in PM  Follow up in 2 months  Do the following things EVERYDAY: 1) Weigh yourself in the morning before breakfast. Write it down and keep it in a log. 2) Take your medicines as prescribed 3) Eat low salt foods--Limit salt (sodium) to 2000 mg per day.  4) Stay as active as you can everyday 5) Limit all fluids for the day to less than 2 liters

## 2012-02-13 NOTE — Progress Notes (Signed)
  Echocardiogram 2D Echocardiogram has been performed.  Emelia Loron 02/13/2012, 4:07 PM

## 2012-02-13 NOTE — Assessment & Plan Note (Addendum)
Volume status stable. I reviewed his ECHO during office visit. EF slightly improved 30-35%. Unable to tolerate any further titration of am b-blocker due to fatigue. Will increase night time carvedilol 9.375 mg. At next visit I may consider switching carvedilol over to nightly bisoprolol for more complete coverage. Discussed defibrillator however he declines again.  Reinforced low salt food choices, limiting fluid intake, and medication compliance. Follow up in 3 weeks.

## 2012-02-13 NOTE — Assessment & Plan Note (Addendum)
No evidence of ischemia. Continue current regimen.   

## 2012-02-13 NOTE — Assessment & Plan Note (Addendum)
Improved with protonix.Will continue.

## 2012-02-16 NOTE — Progress Notes (Signed)
Patient ID: [name] Foley, male   DOB: [birth year], 36 y.o.   MRN: [id number] PCP: Dr Yetta Barre HPI:  [name] Foley is a 36 year old male with a history of morbid obesity, hypertension, diabetes, and congestive heart failure secondary to severe ischemic cardiomyopathy with ejection fraction of 20% previously.  Cardiac catheterization 2009 year showed severe three- vessel coronary artery disease which was not amenable to bypass surgery due to severe distal disease.  He did undergo angioplasty of a portion of his LAD.    11/2009 Echo  EF 40%.  04/2011 Echo EF 20-25% 02/13/12 ECHO EF 30-35%  He returns for follow up today.  Last visit carvedilol increased to 6.25 mg twice a day and protonix added for chest pain. He did tolerate the increased carvedilol due to fatigue and he went back to carvedilol 3.125 mg in am 6. 25mg  at night. Denies SOB/PND/Orthopnea/CP. Reflux improved with protonix. Glucose less then 200 over the last month.He has been offered ICD but he has declines. Complaint with medications. Weight at home 279 pounds. He has not required any extra Lasix. Not following low salt. Eating pizza.   ROS: All systems negative except as listed in HPI, PMH and Problem List.  Past Medical History  Diagnosis Date  . CHF (congestive heart failure)     Secondary to ischemic cardiomyopathy  . CAD (coronary artery disease)     VERY Severe  . Morbid obesity   . Type II or unspecified type diabetes mellitus without mention of complication, not stated as uncontrolled   . Hyperlipidemia   . GERD (gastroesophageal reflux disease)     Current Outpatient Prescriptions  Medication Sig Dispense Refill  . aspirin 325 MG tablet Take 325 mg by mouth daily.        Marland Kitchen atorvastatin (LIPITOR) 80 MG tablet TAKE 1 TABLET (80 MG TOTAL) BY MOUTH DAILY.  30 tablet  0  . carvedilol (COREG) 3.125 MG tablet Take 1 tab in AM and 3 tabs in PM  60 tablet  6  . clopidogrel (PLAVIX) 75 MG tablet TAKE 1 TABLET (75 MG TOTAL) BY MOUTH DAILY.   30 tablet  0  . colesevelam (WELCHOL) 625 MG tablet Take 3 tablets (1,875 mg total) by mouth 2 (two) times daily with a meal.  180 tablet  11  . digoxin (LANOXIN) 0.25 MG tablet Take 1 tablet (250 mcg total) by mouth daily.  30 tablet  11  . fish oil-omega-3 fatty acids 1000 MG capsule Take 2 g by mouth daily.        . furosemide (LASIX) 40 MG tablet TAKE 1 TABLET (40 MG TOTAL) BY MOUTH 2 (TWO) TIMES DAILY.  60 tablet  6  . glucose blood test strip 1 each 2 (two) times daily. Use as instructed       . Multiple Vitamins-Minerals (MENS MULTI VITAMIN & MINERAL PO) Take by mouth. 1 tablet daily.       . pantoprazole (PROTONIX) 40 MG tablet Take 1 tablet (40 mg total) by mouth daily.  30 tablet  6  . potassium chloride SA (K-DUR,KLOR-CON) 20 MEQ tablet Take 20 mEq by mouth daily.        Marland Kitchen spironolactone (ALDACTONE) 25 MG tablet Take 0.5 tablets (12.5 mg total) by mouth 2 (two) times daily.  30 tablet  11  . glimepiride (AMARYL) 4 MG tablet Take 1 tablet (4 mg total) by mouth daily.  30 tablet  11  . insulin detemir (LEVEMIR FLEXPEN) 100 UNIT/ML injection Inject 100 Units  into the skin at bedtime.  10 mL  11  . Linagliptin 5 MG TABS Take 1 tablet by mouth daily.  30 tablet  11  . lisinopril (PRINIVIL) 10 MG tablet Take 10 mg in am and 20 mg in pm  90 tablet  6     PHYSICAL EXAM Filed Vitals:   02/13/12 1101  BP: 114/62  Pulse: 70  Resp: 18  Height: 6\' 1"  (1.854 m)  Weight: 287 lb (130.182 kg)  SpO2: 93%   General:  well appearing. no resp difficulty HEENT: normal Neck: supple. no JVP flat. Carotids 2+ bilat; no bruits. No lymphadenopathy or thryomegaly appreciated. Cor: PMI nondisplaced. Regular rate & rhythm. No rubs, gallops, murmur. Lungs: clear Abdomen: obese soft, nontender, nondistended. No hepatosplenomegaly. No bruits or masses. Good bowel sounds. Extremities: no cyanosis, clubbing, rash, trace edema Neuro: alert & orientedx3, cranial nerves grossly intact. moves all 4 extremities  w/o difficulty. affect normal   ASSESSMENT & PLAN:

## 2012-02-22 ENCOUNTER — Other Ambulatory Visit (HOSPITAL_COMMUNITY): Payer: Self-pay | Admitting: Internal Medicine

## 2012-02-23 NOTE — Addendum Note (Signed)
Encounter addended by: Dolores Patty, MD on: 02/23/2012  9:29 PM<BR>     Documentation filed: Follow-up Section, LOS Section, Charges VN

## 2012-02-24 NOTE — Telephone Encounter (Signed)
Pt needs appointment then refill can be made Fax Received. Refill Completed. Kendon Sedeno Chowoe (R.M.A)   

## 2012-03-03 ENCOUNTER — Ambulatory Visit (HOSPITAL_COMMUNITY)
Admission: RE | Admit: 2012-03-03 | Discharge: 2012-03-03 | Disposition: A | Discharge status: Home / Self Care | Payer: BC Managed Care – PPO | Source: Ambulatory Visit | Attending: Internal Medicine | Admitting: Internal Medicine

## 2012-03-03 VITALS — BP 94/56 | HR 91 | Wt 286.0 lb

## 2012-03-03 DIAGNOSIS — Z794 Long term (current) use of insulin: Secondary | ICD-10-CM | POA: Insufficient documentation

## 2012-03-03 DIAGNOSIS — I5022 Chronic systolic (congestive) heart failure: Secondary | ICD-10-CM | POA: Insufficient documentation

## 2012-03-03 DIAGNOSIS — I509 Heart failure, unspecified: Secondary | ICD-10-CM | POA: Insufficient documentation

## 2012-03-03 DIAGNOSIS — K219 Gastro-esophageal reflux disease without esophagitis: Secondary | ICD-10-CM | POA: Insufficient documentation

## 2012-03-03 DIAGNOSIS — I2510 Atherosclerotic heart disease of native coronary artery without angina pectoris: Secondary | ICD-10-CM

## 2012-03-03 DIAGNOSIS — I2589 Other forms of chronic ischemic heart disease: Secondary | ICD-10-CM | POA: Insufficient documentation

## 2012-03-03 DIAGNOSIS — E119 Type 2 diabetes mellitus without complications: Secondary | ICD-10-CM | POA: Insufficient documentation

## 2012-03-03 DIAGNOSIS — E785 Hyperlipidemia, unspecified: Secondary | ICD-10-CM | POA: Insufficient documentation

## 2012-03-03 DIAGNOSIS — I10 Essential (primary) hypertension: Secondary | ICD-10-CM | POA: Insufficient documentation

## 2012-03-03 DIAGNOSIS — Z7982 Long term (current) use of aspirin: Secondary | ICD-10-CM | POA: Insufficient documentation

## 2012-03-03 MED ORDER — BISOPROLOL FUMARATE 5 MG PO TABS: 5.0000 mg | ORAL_TABLET | Freq: Every day | ORAL | Status: DC

## 2012-03-03 NOTE — Progress Notes (Signed)
PCP: Dr Yetta Barre  HPI:  [name] Foley is a 36 year old male with a history of morbid obesity, hypertension, diabetes, and congestive heart failure secondary to severe ischemic cardiomyopathy with ejection fraction of 20% previously.  Cardiac catheterization 2009 year showed severe three- vessel coronary artery disease which was not amenable to bypass surgery due to severe distal disease.  He did undergo angioplasty of a portion of his LAD.   11/2009 Echo  EF 40%. 04/2011 Echo EF 20-25% 02/13/12 ECHO EF 30-35%  He returns for follow up today.  He was unable to titrate night time dose of carvedilol to 9.375 mg due to fatigue so he is still taking 3.125/6.25 mg.  He continues to complain of fatigue.  He says his volume status is stable.  He denies SOB/orthopnea/PND.  He does complain of soreness/pain in quads almost all the time.  He also says he has toe pain that is related to his diabetes.  He denies chest pain.  His glucose has been ranging 180s-low 200s.  Compliant with meds.     ROS: All systems negative except as listed in HPI, PMH and Problem List.  Past Medical History  Diagnosis Date  . CHF (congestive heart failure)     Secondary to ischemic cardiomyopathy  . CAD (coronary artery disease)     VERY Severe  . Morbid obesity   . Type II or unspecified type diabetes mellitus without mention of complication, not stated as uncontrolled   . Hyperlipidemia   . GERD (gastroesophageal reflux disease)     Current Outpatient Prescriptions  Medication Sig Dispense Refill  . aspirin 325 MG tablet Take 325 mg by mouth daily.        Marland Kitchen atorvastatin (LIPITOR) 80 MG tablet TAKE 1 TABLET (80 MG TOTAL) BY MOUTH DAILY.  30 tablet  0  . carvedilol (COREG) 3.125 MG tablet TAKE 1 TABLET BY MOUTH IN THE MORNING AND 2 TABLETS IN THE EVENING  90 tablet  0  . clopidogrel (PLAVIX) 75 MG tablet TAKE 1 TABLET (75 MG TOTAL) BY MOUTH DAILY.  30 tablet  0  . colesevelam (WELCHOL) 625 MG tablet Take 3 tablets (1,875 mg total)  by mouth 2 (two) times daily with a meal.  180 tablet  11  . digoxin (LANOXIN) 0.25 MG tablet Take 1 tablet (250 mcg total) by mouth daily.  30 tablet  11  . fish oil-omega-3 fatty acids 1000 MG capsule Take 2 g by mouth daily.        . furosemide (LASIX) 40 MG tablet TAKE 1 TABLET (40 MG TOTAL) BY MOUTH 2 (TWO) TIMES DAILY.  60 tablet  6  . glimepiride (AMARYL) 4 MG tablet Take 1 tablet (4 mg total) by mouth daily.  30 tablet  11  . glucose blood test strip 1 each 2 (two) times daily. Use as instructed       . insulin detemir (LEVEMIR FLEXPEN) 100 UNIT/ML injection Inject 100 Units into the skin at bedtime.  10 mL  11  . Linagliptin 5 MG TABS Take 1 tablet by mouth daily.  30 tablet  11  . lisinopril (PRINIVIL) 10 MG tablet Take 10 mg in am and 20 mg in pm  90 tablet  6  . Multiple Vitamins-Minerals (MENS MULTI VITAMIN & MINERAL PO) Take by mouth. 1 tablet daily.       . pantoprazole (PROTONIX) 40 MG tablet Take 1 tablet (40 mg total) by mouth daily.  30 tablet  6  . potassium chloride  SA (K-DUR,KLOR-CON) 20 MEQ tablet Take 20 mEq by mouth daily.        Marland Kitchen spironolactone (ALDACTONE) 25 MG tablet Take 0.5 tablets (12.5 mg total) by mouth 2 (two) times daily.  30 tablet  11     PHYSICAL EXAM Filed Vitals:   03/03/12 1143  BP: 94/56  Pulse: 91  Weight: 286 lb (129.729 kg)  SpO2: 100%   General:  well appearing. no resp difficulty HEENT: normal Neck: supple.  JVP flat. Carotids 2+ bilat; no bruits. No lymphadenopathy or thryomegaly appreciated. Cor: PMI nondisplaced. Regular rate & rhythm. No rubs, gallops, murmur. Lungs: clear Abdomen: obese soft, nontender, nondistended. No hepatosplenomegaly. No bruits or masses. Good bowel sounds. Extremities: no cyanosis, clubbing, rash,  edema Neuro: alert & orientedx3, cranial nerves grossly intact. moves all 4 extremities w/o difficulty. affect normal   ASSESSMENT & PLAN:

## 2012-03-03 NOTE — Assessment & Plan Note (Signed)
No ischemic symptoms, continue current regimen. 

## 2012-03-03 NOTE — Assessment & Plan Note (Signed)
Leg pain is questionable for myalgias from statin, will have him start taking lipitor every other day to see if this helps.  Recheck FLP at follow up, pt is aware to come fasting.

## 2012-03-03 NOTE — Patient Instructions (Addendum)
Stop carvedilol.  Start bisoprolol 2.5 mg (0.5 tab) nightly.  Take lipitor every other day.   Follow up 6 weeks with fasting lipid panel.

## 2012-03-03 NOTE — Assessment & Plan Note (Signed)
Volume status stable but continues to complain of fatigue.  He has been unable to titrate carvedilol due to increased fatigue.  Will try to change patient to bisoprolol as it has low lipid solubility and may cause less fatigue.  Have instructed him to take bisoprolol 2.5 mg nightly.  He will start this medication in 2-3 weeks once he finishes his current prescription.  Continue current diuretics.  Encouraged low sodium diet.  Follow up 6 weeks after trial of bisoprolol.

## 2012-04-06 ENCOUNTER — Other Ambulatory Visit (HOSPITAL_COMMUNITY): Payer: Self-pay | Admitting: *Deleted

## 2012-04-06 MED ORDER — CLOPIDOGREL BISULFATE 75 MG PO TABS: 75.0000 mg | ORAL_TABLET | Freq: Every day | ORAL | Status: DC

## 2012-04-06 MED ORDER — ATORVASTATIN CALCIUM 80 MG PO TABS: 80.0000 mg | ORAL_TABLET | Freq: Every day | ORAL | Status: DC

## 2012-04-06 MED ORDER — PANTOPRAZOLE SODIUM 40 MG PO TBEC: 40.0000 mg | DELAYED_RELEASE_TABLET | Freq: Every day | ORAL | Status: DC

## 2012-04-06 MED ORDER — BISOPROLOL FUMARATE 5 MG PO TABS: 5.0000 mg | ORAL_TABLET | Freq: Every day | ORAL | Status: DC

## 2012-04-13 ENCOUNTER — Ambulatory Visit (HOSPITAL_COMMUNITY): Payer: Medicare Other | Attending: Internal Medicine

## 2012-06-03 ENCOUNTER — Other Ambulatory Visit: Payer: Self-pay | Admitting: Internal Medicine

## 2012-07-06 ENCOUNTER — Other Ambulatory Visit: Payer: Self-pay | Admitting: Internal Medicine

## 2012-08-03 ENCOUNTER — Other Ambulatory Visit: Payer: Self-pay | Admitting: Internal Medicine

## 2012-09-07 ENCOUNTER — Other Ambulatory Visit: Payer: Self-pay | Admitting: Internal Medicine

## 2012-09-11 ENCOUNTER — Other Ambulatory Visit: Payer: Self-pay | Admitting: Internal Medicine

## 2012-09-25 ENCOUNTER — Ambulatory Visit: Payer: Medicare Other | Admitting: Internal Medicine

## 2012-09-25 DIAGNOSIS — Z0289 Encounter for other administrative examinations: Secondary | ICD-10-CM

## 2012-10-05 ENCOUNTER — Other Ambulatory Visit: Payer: Self-pay | Admitting: Internal Medicine

## 2012-10-06 ENCOUNTER — Telehealth: Payer: Self-pay

## 2012-10-06 NOTE — Telephone Encounter (Signed)
Patient called stating that refill for levemir was sent in for a quantity of 3ml, which last for 15 days only. Patient stated that he had to pay 45$ copay and in the pay his quantity has been more. I called the pharmacy who advised that it should be (30day supply). She states that she will deactivate incorrect script and we can send in a corrected one to be placed on hold. Patient notified and will pick up samples to compensate for copay.

## 2012-10-30 ENCOUNTER — Other Ambulatory Visit (HOSPITAL_COMMUNITY): Payer: Self-pay | Admitting: Adult Health

## 2012-10-30 DIAGNOSIS — I10 Essential (primary) hypertension: Secondary | ICD-10-CM

## 2012-11-06 ENCOUNTER — Other Ambulatory Visit: Payer: Self-pay | Admitting: Internal Medicine

## 2012-11-19 ENCOUNTER — Other Ambulatory Visit (INDEPENDENT_AMBULATORY_CARE_PROVIDER_SITE_OTHER): Payer: BC Managed Care – PPO

## 2012-11-19 ENCOUNTER — Ambulatory Visit (INDEPENDENT_AMBULATORY_CARE_PROVIDER_SITE_OTHER): Payer: BC Managed Care – PPO | Admitting: Internal Medicine

## 2012-11-19 ENCOUNTER — Encounter: Payer: Self-pay | Admitting: Internal Medicine

## 2012-11-19 VITALS — BP 108/64 | HR 73 | Temp 98.4°F | Resp 16 | Wt 265.0 lb

## 2012-11-19 DIAGNOSIS — T07XXXA Unspecified multiple injuries, initial encounter: Secondary | ICD-10-CM

## 2012-11-19 DIAGNOSIS — E781 Pure hyperglyceridemia: Secondary | ICD-10-CM

## 2012-11-19 DIAGNOSIS — IMO0001 Reserved for inherently not codable concepts without codable children: Secondary | ICD-10-CM

## 2012-11-19 DIAGNOSIS — I2510 Atherosclerotic heart disease of native coronary artery without angina pectoris: Secondary | ICD-10-CM

## 2012-11-19 DIAGNOSIS — F329 Major depressive disorder, single episode, unspecified: Secondary | ICD-10-CM

## 2012-11-19 DIAGNOSIS — I10 Essential (primary) hypertension: Secondary | ICD-10-CM

## 2012-11-19 DIAGNOSIS — E1142 Type 2 diabetes mellitus with diabetic polyneuropathy: Secondary | ICD-10-CM

## 2012-11-19 DIAGNOSIS — F3289 Other specified depressive episodes: Secondary | ICD-10-CM

## 2012-11-19 DIAGNOSIS — E1149 Type 2 diabetes mellitus with other diabetic neurological complication: Secondary | ICD-10-CM

## 2012-11-19 DIAGNOSIS — T148XXD Other injury of unspecified body region, subsequent encounter: Secondary | ICD-10-CM

## 2012-11-19 DIAGNOSIS — E1140 Type 2 diabetes mellitus with diabetic neuropathy, unspecified: Secondary | ICD-10-CM

## 2012-11-19 DIAGNOSIS — E785 Hyperlipidemia, unspecified: Secondary | ICD-10-CM

## 2012-11-19 DIAGNOSIS — I5022 Chronic systolic (congestive) heart failure: Secondary | ICD-10-CM

## 2012-11-19 DIAGNOSIS — Z23 Encounter for immunization: Secondary | ICD-10-CM

## 2012-11-19 LAB — URINALYSIS, ROUTINE W REFLEX MICROSCOPIC
Leukocytes, UA: NEGATIVE
Specific Gravity, Urine: <=1.005 (ref 1.000–1.030)
Urobilinogen, UA: 0.2 (ref 0.0–1.0)
pH: 6 (ref 5.0–8.0)

## 2012-11-19 LAB — CBC WITH DIFFERENTIAL/PLATELET
Basophils Absolute: 0 10*3/uL (ref 0.0–0.1)
Eosinophils Absolute: 0.1 10*3/uL (ref 0.0–0.7)
MCHC: 34.1 g/dL (ref 30.0–36.0)
MCV: 94.2 fl (ref 78.0–100.0)
Monocytes Absolute: 0.5 10*3/uL (ref 0.1–1.0)
Neutrophils Relative %: 70.2 % (ref 43.0–77.0)
Platelets: 259 10*3/uL (ref 150.0–400.0)

## 2012-11-19 LAB — MICROALBUMIN / CREATININE URINE RATIO
Creatinine,U: 54.1 mg/dL
Microalb Creat Ratio: 5.4 mg/g (ref 0.0–30.0)

## 2012-11-19 LAB — COMPREHENSIVE METABOLIC PANEL
ALT: 29 U/L (ref 0–53)
AST: 23 U/L (ref 0–37)
Albumin: 3.8 g/dL (ref 3.5–5.2)
Alkaline Phosphatase: 110 U/L (ref 39–117)
Potassium: 4.4 mEq/L (ref 3.5–5.1)
Sodium: 127 mEq/L — ABNORMAL LOW (ref 135–145)
Total Protein: 7.4 g/dL (ref 6.0–8.3)

## 2012-11-19 LAB — TSH: TSH: 0.63 u[IU]/mL (ref 0.35–5.50)

## 2012-11-19 LAB — LIPID PANEL
HDL: 35.4 mg/dL — ABNORMAL LOW (ref >39.00)
Triglycerides: 1424 mg/dL — ABNORMAL HIGH (ref 0.0–149.0)
VLDL: 284.8 mg/dL — ABNORMAL HIGH (ref 0.0–40.0)

## 2012-11-19 MED ORDER — OMEGA-3-ACID ETHYL ESTERS 1 G PO CAPS: 2.0000 g | ORAL_CAPSULE | Freq: Two times a day (BID) | ORAL | Status: DC

## 2012-11-19 MED ORDER — SITAGLIPTIN PHOSPHATE 100 MG PO TABS: 100.0000 mg | ORAL_TABLET | Freq: Every day | ORAL | Status: DC

## 2012-11-19 MED ORDER — CANAGLIFLOZIN 300 MG PO TABS: 300.0000 mg | ORAL_TABLET | Freq: Every day | ORAL | Status: DC

## 2012-11-19 MED ORDER — INSULIN DETEMIR 100 UNIT/ML ~~LOC~~ SOLN: 100.0000 [IU] | Freq: Every day | SUBCUTANEOUS | Status: DC

## 2012-11-19 MED ORDER — DULOXETINE HCL 30 MG PO CPEP: 30.0000 mg | ORAL_CAPSULE | Freq: Every day | ORAL | Status: DC

## 2012-11-19 MED ORDER — PREGABALIN 50 MG PO CAPS: 50.0000 mg | ORAL_CAPSULE | Freq: Three times a day (TID) | ORAL | Status: DC

## 2012-11-19 MED ORDER — ATORVASTATIN CALCIUM 80 MG PO TABS: 80.0000 mg | ORAL_TABLET | Freq: Every day | ORAL | Status: DC

## 2012-11-19 NOTE — Progress Notes (Signed)
Subjective:    Patient ID: [name] Foley, male    DOB: April 15, 1976, 37 y.o.   MRN: [id number]  Diabetes He presents for his follow-up diabetic visit. He has type 2 diabetes mellitus. His disease course has been worsening. There are no hypoglycemic associated symptoms. Pertinent negatives for hypoglycemia include no confusion, dizziness, headaches, nervousness/anxiousness, pallor or tremors. Associated symptoms include foot paresthesias (burning and tingling ni his feet), polydipsia, polyphagia and polyuria. Pertinent negatives for diabetes include no blurred vision, no chest pain, no fatigue, no foot ulcerations, no visual change, no weakness and no weight loss. There are no hypoglycemic complications. Symptoms are worsening. Diabetic complications include heart disease and peripheral neuropathy. When asked about current treatments, none were reported. He is compliant with treatment none of the time. His weight is stable. He is following a generally unhealthy diet. When asked about meal planning, he reported none. He never participates in exercise. His home blood glucose trend is increasing steadily. His breakfast blood glucose range is generally >200 mg/dl. His lunch blood glucose range is generally >200 mg/dl. His dinner blood glucose range is generally >200 mg/dl. His highest blood glucose is >200 mg/dl. His overall blood glucose range is >200 mg/dl. An ACE inhibitor/angiotensin II receptor blocker is being taken. He does not see a podiatrist.Eye exam is current.      Review of Systems  Constitutional: Negative.  Negative for fever, chills, weight loss, diaphoresis, activity change, appetite change, fatigue and unexpected weight change.  HENT: Negative.   Eyes: Negative.  Negative for blurred vision.  Respiratory: Negative.  Negative for cough, chest tightness, shortness of breath, wheezing and stridor.   Cardiovascular: Negative.  Negative for chest pain, palpitations and leg swelling.    Gastrointestinal: Negative.  Negative for nausea, vomiting, abdominal pain, diarrhea, constipation and blood in stool.  Endocrine: Positive for polydipsia, polyphagia and polyuria.  Musculoskeletal: Negative.  Negative for myalgias, back pain, joint swelling, arthralgias and gait problem.  Skin: Positive for wound (burn on right lower leg has not healed). Negative for color change, pallor and rash.       He burnt his right lower leg one month ago on a muffler  Allergic/Immunologic: Negative.   Neurological: Negative.  Negative for dizziness, tremors, weakness, light-headedness, numbness and headaches.  Hematological: Negative.  Negative for adenopathy. Does not bruise/bleed easily.  Psychiatric/Behavioral: Positive for dysphoric mood (sadness, crying spells, anhedonia). Negative for suicidal ideas, hallucinations, behavioral problems, confusion, sleep disturbance, self-injury, decreased concentration and agitation. The patient is not nervous/anxious and is not hyperactive.        Objective:   Physical Exam  Vitals reviewed. Constitutional: He is oriented to person, place, and time. He appears well-developed and well-nourished. No distress.  HENT:  Head: Normocephalic and atraumatic.  Mouth/Throat: Oropharynx is clear and moist. No oropharyngeal exudate.  Eyes: Conjunctivae are normal. Right eye exhibits no discharge. Left eye exhibits no discharge. No scleral icterus.  Neck: Normal range of motion. Neck supple. No JVD present. No tracheal deviation present. No thyromegaly present.  Cardiovascular: Normal rate, regular rhythm, S1 normal, S2 normal, normal heart sounds and intact distal pulses.  Exam reveals no gallop and no friction rub.   No murmur heard. Pulses:      Carotid pulses are 1+ on the right side, and 1+ on the left side.      Radial pulses are 1+ on the right side, and 1+ on the left side.       Femoral pulses are 1+ on  the right side, and 1+ on the left side.      Popliteal  pulses are 1+ on the right side, and 1+ on the left side.       Dorsalis pedis pulses are 1+ on the right side, and 1+ on the left side.       Posterior tibial pulses are 1+ on the right side, and 1+ on the left side.  Pulmonary/Chest: Effort normal and breath sounds normal. No stridor. No respiratory distress. He has no wheezes. He has no rales. He exhibits no tenderness.  Abdominal: Soft. Bowel sounds are normal. He exhibits no distension and no mass. There is no tenderness. There is no rebound and no guarding.  Musculoskeletal: Normal range of motion. He exhibits no edema and no tenderness.  Lymphadenopathy:    He has no cervical adenopathy.  Neurological: He is oriented to person, place, and time.  Skin: Skin is warm and dry. Burn noted. No abrasion, no bruising, no ecchymosis, no laceration, no lesion, no petechiae and no rash noted. He is not diaphoretic. No cyanosis or erythema. No pallor. Nails show no clubbing.     Psychiatric: He has a normal mood and affect. His behavior is normal. Judgment and thought content normal.      Lab Results  Component Value Date   WBC 9.9 07/25/2011   HGB 15.1 07/25/2011   HCT 42.8 07/25/2011   PLT 191.0 07/25/2011   GLUCOSE 393* 07/25/2011   CHOL 169 07/25/2011   TRIG 522.0* 07/25/2011   HDL 34.00* 07/25/2011   LDLDIRECT 68.6 07/25/2011   LDLCALC  Value: 165        Total Cholesterol/HDL:CHD Risk Coronary Heart Disease Risk Table                     Men   Women  1/2 Average Risk   3.4   3.3* 10/24/2007   ALT 24 07/25/2011   AST 19 07/25/2011   NA 132* 07/25/2011   K 4.8 07/25/2011   CL 96 07/25/2011   CREATININE 0.8 07/25/2011   BUN 23 07/25/2011   CO2 26 07/25/2011   TSH 0.90 07/25/2011   INR 1.0 RATIO 11/12/2007   HGBA1C 12.0* 07/25/2011      Assessment & Plan:

## 2012-11-19 NOTE — Assessment & Plan Note (Signed)
-  Start cymbalta

## 2012-11-19 NOTE — Assessment & Plan Note (Signed)
Restart insulin and start lovaza

## 2012-11-19 NOTE — Assessment & Plan Note (Signed)
Start lyrica I have asked him to see neurology as well

## 2012-11-19 NOTE — Assessment & Plan Note (Signed)
He needs a cardiology follow up

## 2012-11-19 NOTE — Patient Instructions (Signed)

## 2012-11-19 NOTE — Assessment & Plan Note (Signed)
I will check his A1C and will monitor his renal function I have asked him to restart meds and will add invokana He needs to have an annual eye exam done as well

## 2012-11-19 NOTE — Assessment & Plan Note (Addendum)
The wound does not appear to be infected I am concerned about the poor healing and think a visit with the wound care center is indicated

## 2012-11-19 NOTE — Assessment & Plan Note (Signed)
His BP is well controlled Today I will check his lytes and renal function 

## 2012-11-20 ENCOUNTER — Telehealth: Payer: Self-pay | Admitting: Internal Medicine

## 2012-11-20 ENCOUNTER — Encounter: Payer: Self-pay | Admitting: Internal Medicine

## 2012-11-20 LAB — DIGOXIN LEVEL: Digoxin Level: 1.3 ng/mL (ref 0.8–2.0)

## 2012-11-20 NOTE — Telephone Encounter (Signed)
Patient can not afford the Lovaza that was called in and he would like to have something cheaper called in

## 2012-11-20 NOTE — Telephone Encounter (Signed)
He can get otc fish oils as well

## 2012-11-23 ENCOUNTER — Telehealth: Payer: Self-pay

## 2012-11-23 DIAGNOSIS — E1140 Type 2 diabetes mellitus with diabetic neuropathy, unspecified: Secondary | ICD-10-CM

## 2012-11-23 NOTE — Telephone Encounter (Signed)
Pt notified per MD 

## 2012-11-23 NOTE — Telephone Encounter (Signed)
Cassie with [facility] called [facility] stating that per [facility], there was not enough of urine to collect a drug screen and original order was c/x. Orders need to be reentered per Cassie, please advise on DX for this order. Thanks

## 2012-11-24 NOTE — Telephone Encounter (Signed)
I reordered it with a diagnosis

## 2012-11-26 ENCOUNTER — Ambulatory Visit (HOSPITAL_COMMUNITY): Payer: Medicare Other | Attending: Internal Medicine

## 2012-11-27 LAB — DRUGS OF ABUSE SCREEN W/O ALC, ROUTINE URINE

## 2012-11-30 ENCOUNTER — Encounter (HOSPITAL_BASED_OUTPATIENT_CLINIC_OR_DEPARTMENT_OTHER): Payer: BC Managed Care – PPO

## 2012-11-30 ENCOUNTER — Telehealth: Payer: Self-pay | Admitting: Internal Medicine

## 2012-11-30 NOTE — Telephone Encounter (Signed)
Patient has a question about his new medications and the possible side effects

## 2012-12-02 NOTE — Telephone Encounter (Signed)
Returned call//[facility] to call back or send detailed [employer]. Need to know more information

## 2012-12-08 ENCOUNTER — Ambulatory Visit (HOSPITAL_COMMUNITY)
Admission: RE | Admit: 2012-12-08 | Discharge: 2012-12-08 | Disposition: A | Discharge status: Home / Self Care | Payer: BC Managed Care – PPO | Source: Ambulatory Visit | Attending: Internal Medicine | Admitting: Internal Medicine

## 2012-12-08 VITALS — BP 92/58 | HR 80 | Wt 265.0 lb

## 2012-12-08 DIAGNOSIS — M79609 Pain in unspecified limb: Secondary | ICD-10-CM | POA: Insufficient documentation

## 2012-12-08 DIAGNOSIS — E119 Type 2 diabetes mellitus without complications: Secondary | ICD-10-CM | POA: Insufficient documentation

## 2012-12-08 DIAGNOSIS — I2510 Atherosclerotic heart disease of native coronary artery without angina pectoris: Secondary | ICD-10-CM | POA: Insufficient documentation

## 2012-12-08 DIAGNOSIS — E785 Hyperlipidemia, unspecified: Secondary | ICD-10-CM | POA: Insufficient documentation

## 2012-12-08 DIAGNOSIS — I10 Essential (primary) hypertension: Secondary | ICD-10-CM | POA: Insufficient documentation

## 2012-12-08 DIAGNOSIS — I2589 Other forms of chronic ischemic heart disease: Secondary | ICD-10-CM | POA: Insufficient documentation

## 2012-12-08 DIAGNOSIS — Z7982 Long term (current) use of aspirin: Secondary | ICD-10-CM | POA: Insufficient documentation

## 2012-12-08 DIAGNOSIS — Z7902 Long term (current) use of antithrombotics/antiplatelets: Secondary | ICD-10-CM | POA: Insufficient documentation

## 2012-12-08 DIAGNOSIS — I509 Heart failure, unspecified: Secondary | ICD-10-CM | POA: Insufficient documentation

## 2012-12-08 DIAGNOSIS — Z79899 Other long term (current) drug therapy: Secondary | ICD-10-CM | POA: Insufficient documentation

## 2012-12-08 DIAGNOSIS — K219 Gastro-esophageal reflux disease without esophagitis: Secondary | ICD-10-CM | POA: Insufficient documentation

## 2012-12-08 DIAGNOSIS — Z794 Long term (current) use of insulin: Secondary | ICD-10-CM | POA: Insufficient documentation

## 2012-12-08 DIAGNOSIS — I959 Hypotension, unspecified: Secondary | ICD-10-CM | POA: Insufficient documentation

## 2012-12-08 DIAGNOSIS — I5022 Chronic systolic (congestive) heart failure: Secondary | ICD-10-CM

## 2012-12-08 MED ORDER — SPIRONOLACTONE 25 MG PO TABS: 25.0000 mg | ORAL_TABLET | Freq: Every day | ORAL | Status: DC

## 2012-12-08 MED ORDER — CLOPIDOGREL BISULFATE 75 MG PO TABS: 75.0000 mg | ORAL_TABLET | Freq: Every day | ORAL | Status: DC

## 2012-12-08 MED ORDER — ATORVASTATIN CALCIUM 80 MG PO TABS: 80.0000 mg | ORAL_TABLET | Freq: Every day | ORAL | Status: DC

## 2012-12-08 MED ORDER — POTASSIUM CHLORIDE CRYS ER 20 MEQ PO TBCR: 20.0000 meq | EXTENDED_RELEASE_TABLET | Freq: Every day | ORAL | Status: DC

## 2012-12-08 MED ORDER — BISOPROLOL FUMARATE 5 MG PO TABS: 5.0000 mg | ORAL_TABLET | Freq: Every day | ORAL | Status: DC

## 2012-12-08 MED ORDER — DIGOXIN 250 MCG PO TABS: 0.2500 mg | ORAL_TABLET | Freq: Every day | ORAL | Status: DC

## 2012-12-08 MED ORDER — FUROSEMIDE 40 MG PO TABS: 40.0000 mg | ORAL_TABLET | Freq: Two times a day (BID) | ORAL | Status: DC

## 2012-12-08 MED ORDER — LISINOPRIL 10 MG PO TABS: 10.0000 mg | ORAL_TABLET | Freq: Two times a day (BID) | ORAL | Status: DC

## 2012-12-08 NOTE — Assessment & Plan Note (Signed)
NYHA II-III.  Will continue current therapy as blood pressure too soft to titrate.  Will follow up in 2 months with repeat echo.  Will discuss again need for defibrillator if EF remains less than 35% although the patient is extremely reluctant to proceed.  He has a nice grasp of sliding scale lasix and encouraged him to continue to weigh daily.

## 2012-12-08 NOTE — Assessment & Plan Note (Signed)
He has cut back on lisinopril to 10 mg BID due to hypotension.  Will continue current regimen.  He will call if he becomes symptomatic.

## 2012-12-08 NOTE — Progress Notes (Signed)
PCP: Dr Yetta Barre  HPI:  [name] Foley is a 37 year old male with a history of morbid obesity, hypertension, diabetes, and congestive heart failure secondary to severe ischemic cardiomyopathy with ejection fraction of 20% previously.  Cardiac catheterization 2009 year showed severe three- vessel coronary artery disease which was not amenable to bypass surgery due to severe distal disease.  He did undergo angioplasty of a portion of his LAD.   11/2009 Echo  EF 40%. 04/2011 Echo EF 20-25% 02/13/12 ECHO EF 30-35%  He returns for follow up today.  He was unable to titrate night time dose of carvedilol to 9.375 mg due to fatigue so he is still taking 3.125/6.25 mg.  He continues to complain of fatigue.  He says his volume status is stable.  He denies SOB/orthopnea/PND.  He does complain of soreness/pain in quads almost all the time.  He also says he has toe pain that is related to his diabetes.  He denies chest pain.  His glucose has been ranging 180s-low 200s.  Compliant with meds.   He returns for follow up today.  Says he feels pretty good.  Last visit carvedilol changed to bisoprolol due to fatigue.  He says he can notice a difference and feels better.  He says weight is up a little over the last couple of days and he has added extra lasix which is improving edema.  Glucose has been running up and down but improving with new medications.  He denies chest pain.  No orthopnea/PND.  +cough with nasal drainage.   ROS: All systems negative except as listed in HPI, PMH and Problem List.  Past Medical History  Diagnosis Date  . CHF (congestive heart failure)     Secondary to ischemic cardiomyopathy  . CAD (coronary artery disease)     VERY Severe  . Morbid obesity   . Type II or unspecified type diabetes mellitus without mention of complication, not stated as uncontrolled   . Hyperlipidemia   . GERD (gastroesophageal reflux disease)     Current Outpatient Prescriptions  Medication Sig Dispense Refill  .  aspirin 325 MG tablet Take 325 mg by mouth daily.        Marland Kitchen atorvastatin (LIPITOR) 80 MG tablet Take 1 tablet (80 mg total) by mouth daily.  90 tablet  1  . bisoprolol (ZEBETA) 5 MG tablet Take 1 tablet (5 mg total) by mouth daily.  30 tablet  6  . Canagliflozin (INVOKANA) 300 MG TABS Take 300 mg by mouth daily.  70 tablet  0  . clopidogrel (PLAVIX) 75 MG tablet Take 1 tablet (75 mg total) by mouth daily.  30 tablet  6  . digoxin (LANOXIN) 0.25 MG tablet TAKE ONE TABLET BY MOUTH EVERY DAY  30 tablet  6  . fish oil-omega-3 fatty acids 1000 MG capsule Take 2 g by mouth daily.        . furosemide (LASIX) 40 MG tablet TAKE ONE TABLET BY MOUTH TWICE DAILY  60 tablet  1  . glucose blood test strip 1 each 2 (two) times daily. Use as instructed       . insulin detemir (LEVEMIR FLEXPEN) 100 UNIT/ML injection Inject 1 mL (100 Units total) into the skin at bedtime.  3 mL  6  . lisinopril (PRINIVIL,ZESTRIL) 10 MG tablet Take 10 mg by mouth 2 (two) times daily.       . Multiple Vitamins-Minerals (MENS MULTI VITAMIN & MINERAL PO) Take by mouth. 1 tablet daily.       Marland Kitchen  pantoprazole (PROTONIX) 40 MG tablet Take 1 tablet (40 mg total) by mouth daily.  30 tablet  6  . potassium chloride SA (K-DUR,KLOR-CON) 20 MEQ tablet Take 20 mEq by mouth daily.        . pregabalin (LYRICA) 50 MG capsule Take 50 mg by mouth 3 (three) times daily as needed.      . sitaGLIPtin (JANUVIA) 100 MG tablet Take 1 tablet (100 mg total) by mouth daily.  90 tablet  3  . spironolactone (ALDACTONE) 25 MG tablet Take 25 mg by mouth daily.       . colesevelam (WELCHOL) 625 MG tablet Take 3 tablets (1,875 mg total) by mouth 2 (two) times daily with a meal.  180 tablet  11   No current facility-administered medications for this encounter.     PHYSICAL EXAM Filed Vitals:   12/08/12 1239  BP: 92/58  Pulse: 80  Weight: 265 lb (120.203 kg)  SpO2: 99%   General:  well appearing. no resp difficulty HEENT: normal Neck: supple.  JVP flat.  Carotids 2+ bilat; no bruits. No lymphadenopathy or thryomegaly appreciated. Cor: PMI nondisplaced. Regular rate & rhythm. No rubs, gallops, murmur. Lungs: clear Abdomen: obese soft, nontender, nondistended. No hepatosplenomegaly. No bruits or masses. Good bowel sounds. Extremities: no cyanosis, clubbing, rash,  edema Neuro: alert & orientedx3, cranial nerves grossly intact. moves all 4 extremities w/o difficulty. affect normal   ASSESSMENT & PLAN:

## 2012-12-08 NOTE — Assessment & Plan Note (Signed)
No ischemic symptoms, continue current regimen.

## 2012-12-08 NOTE — Patient Instructions (Signed)
Follow up in 2 months with echo

## 2012-12-10 ENCOUNTER — Other Ambulatory Visit: Payer: Self-pay

## 2012-12-10 MED ORDER — PREGABALIN 50 MG PO CAPS: 50.0000 mg | ORAL_CAPSULE | Freq: Three times a day (TID) | ORAL | Status: DC | PRN

## 2012-12-14 ENCOUNTER — Ambulatory Visit: Payer: Medicare Other | Admitting: Neurology

## 2013-01-03 ENCOUNTER — Other Ambulatory Visit: Payer: Self-pay | Admitting: Internal Medicine

## 2013-01-08 ENCOUNTER — Other Ambulatory Visit: Payer: Self-pay | Admitting: Internal Medicine

## 2013-01-18 ENCOUNTER — Encounter: Payer: Self-pay | Admitting: Internal Medicine

## 2013-01-18 ENCOUNTER — Telehealth: Payer: Self-pay

## 2013-01-18 ENCOUNTER — Ambulatory Visit (INDEPENDENT_AMBULATORY_CARE_PROVIDER_SITE_OTHER): Payer: BC Managed Care – PPO | Admitting: Internal Medicine

## 2013-01-18 ENCOUNTER — Other Ambulatory Visit (INDEPENDENT_AMBULATORY_CARE_PROVIDER_SITE_OTHER): Payer: Medicare Other

## 2013-01-18 VITALS — BP 108/64 | HR 78 | Temp 98.7°F | Resp 16 | Ht 73.0 in | Wt 255.0 lb

## 2013-01-18 DIAGNOSIS — I10 Essential (primary) hypertension: Secondary | ICD-10-CM

## 2013-01-18 DIAGNOSIS — E781 Pure hyperglyceridemia: Secondary | ICD-10-CM

## 2013-01-18 DIAGNOSIS — IMO0001 Reserved for inherently not codable concepts without codable children: Secondary | ICD-10-CM

## 2013-01-18 DIAGNOSIS — E785 Hyperlipidemia, unspecified: Secondary | ICD-10-CM

## 2013-01-18 LAB — BASIC METABOLIC PANEL
CO2: 25 mEq/L (ref 19–32)
Calcium: 9 mg/dL (ref 8.4–10.5)
Potassium: 4.2 mEq/L (ref 3.5–5.1)
Sodium: 128 mEq/L — ABNORMAL LOW (ref 135–145)

## 2013-01-18 LAB — LIPID PANEL
Total CHOL/HDL Ratio: 6
Triglycerides: 1181 mg/dL — ABNORMAL HIGH (ref 0.0–149.0)

## 2013-01-18 MED ORDER — INSULIN ASPART 100 UNIT/ML FLEXPEN: 5.0000 [IU] | PEN_INJECTOR | Freq: Three times a day (TID) | SUBCUTANEOUS | Status: DC

## 2013-01-18 MED ORDER — ICOSAPENT ETHYL 1 G PO CAPS: 2.0000 | ORAL_CAPSULE | Freq: Two times a day (BID) | ORAL | Status: DC

## 2013-01-18 NOTE — Telephone Encounter (Signed)
FYI..Tonya with [facility] called to result a CBG of [phone]

## 2013-01-18 NOTE — Assessment & Plan Note (Signed)
He has not been able to afford lovaza so I will try Vascepa He has no s/s of pancreatitis, I will recheck his FLP today

## 2013-01-18 NOTE — Patient Instructions (Signed)
Hypertriglyceridemia  Diet for High blood levels of Triglycerides Most fats in food are triglycerides. Triglycerides in your blood are stored as fat in your body. High levels of triglycerides in your blood may put you at a greater risk for heart disease and stroke.  Normal triglyceride levels are less than 150 mg/dL. Borderline high levels are 150-199 mg/dl. High levels are 200 - 499 mg/dL, and very high triglyceride levels are greater than 500 mg/dL. The decision to treat high triglycerides is generally based on the level. For people with borderline or high triglyceride levels, treatment includes weight loss and exercise. Drugs are recommended for people with very high triglyceride levels. Many people who need treatment for high triglyceride levels have metabolic syndrome. This syndrome is a collection of disorders that often include: insulin resistance, high blood pressure, blood clotting problems, high cholesterol and triglycerides. TESTING PROCEDURE FOR TRIGLYCERIDES  You should not eat 4 hours before getting your triglycerides measured. The normal range of triglycerides is between 10 and 250 milligrams per deciliter (mg/dl). Some people may have extreme levels (1000 or above), but your triglyceride level may be too high if it is above 150 mg/dl, depending on what other risk factors you have for heart disease.  People with high blood triglycerides may also have high blood cholesterol levels. If you have high blood cholesterol as well as high blood triglycerides, your risk for heart disease is probably greater than if you only had high triglycerides. High blood cholesterol is one of the main risk factors for heart disease. CHANGING YOUR DIET  Your weight can affect your blood triglyceride level. If you are more than 20% above your ideal body weight, you may be able to lower your blood triglycerides by losing weight. Eating less and exercising regularly is the best way to combat this. Fat provides more  calories than any other food. The best way to lose weight is to eat less fat. Only 30% of your total calories should come from fat. Less than 7% of your diet should come from saturated fat. A diet low in fat and saturated fat is the same as a diet to decrease blood cholesterol. By eating a diet lower in fat, you may lose weight, lower your blood cholesterol, and lower your blood triglyceride level.  Eating a diet low in fat, especially saturated fat, may also help you lower your blood triglyceride level. Ask your dietitian to help you figure how much fat you can eat based on the number of calories your caregiver has prescribed for you.  Exercise, in addition to helping with weight loss may also help lower triglyceride levels.   Alcohol can increase blood triglycerides. You may need to stop drinking alcoholic beverages.  Too much carbohydrate in your diet may also increase your blood triglycerides. Some complex carbohydrates are necessary in your diet. These may include bread, rice, potatoes, other starchy vegetables and cereals.  Reduce "simple" carbohydrates. These may include pure sugars, candy, honey, and jelly without losing other nutrients. If you have the kind of high blood triglycerides that is affected by the amount of carbohydrates in your diet, you will need to eat less sugar and less high-sugar foods. Your caregiver can help you with this.  Adding 2-4 grams of fish oil (EPA+ DHA) may also help lower triglycerides. Speak with your caregiver before adding any supplements to your regimen. Following the Diet  Maintain your ideal weight. Your caregivers can help you with a diet. Generally, eating less food and getting more   exercise will help you lose weight. Joining a weight control group may also help. Ask your caregivers for a good weight control group in your area.  Eat low-fat foods instead of high-fat foods. This can help you lose weight too.  These foods are lower in fat. Eat MORE of these:    Dried beans, peas, and lentils.  Egg whites.  Low-fat cottage cheese.  Fish.  Lean cuts of meat, such as round, sirloin, rump, and flank (cut extra fat off meat you fix).  Whole grain breads, cereals and pasta.  Skim and nonfat dry milk.  Low-fat yogurt.  Poultry without the skin.  Cheese made with skim or part-skim milk, such as mozzarella, parmesan, farmers', ricotta, or pot cheese. These are higher fat foods. Eat LESS of these:   Whole milk and foods made from whole milk, such as American, blue, cheddar, monterey jack, and swiss cheese  High-fat meats, such as luncheon meats, sausages, knockwurst, bratwurst, hot dogs, ribs, corned beef, ground pork, and regular ground beef.  Fried foods. Limit saturated fats in your diet. Substituting unsaturated fat for saturated fat may decrease your blood triglyceride level. You will need to read package labels to know which products contain saturated fats.  These foods are high in saturated fat. Eat LESS of these:   Fried pork skins.  Whole milk.  Skin and fat from poultry.  Palm oil.  Butter.  Shortening.  Cream cheese.  Bacon.  Margarines and baked goods made from listed oils.  Vegetable shortenings.  Chitterlings.  Fat from meats.  Coconut oil.  Palm kernel oil.  Lard.  Cream.  Sour cream.  Fatback.  Coffee whiteners and non-dairy creamers made with these oils.  Cheese made from whole milk. Use unsaturated fats (both polyunsaturated and monounsaturated) moderately. Remember, even though unsaturated fats are better than saturated fats; you still want a diet low in total fat.  These foods are high in unsaturated fat:   Canola oil.  Sunflower oil.  Mayonnaise.  Almonds.  Peanuts.  Pine nuts.  Margarines made with these oils.  Safflower oil.  Olive oil.  Avocados.  Cashews.  Peanut butter.  Sunflower seeds.  Soybean oil.  Peanut  oil.  Olives.  Pecans.  Walnuts.  Pumpkin seeds. Avoid sugar and other high-sugar foods. This will decrease carbohydrates without decreasing other nutrients. Sugar in your food goes rapidly to your blood. When there is excess sugar in your blood, your liver may use it to make more triglycerides. Sugar also contains calories without other important nutrients.  Eat LESS of these:   Sugar, brown sugar, powdered sugar, jam, jelly, preserves, honey, syrup, molasses, pies, candy, cakes, cookies, frosting, pastries, colas, soft drinks, punches, fruit drinks, and regular gelatin.  Avoid alcohol. Alcohol, even more than sugar, may increase blood triglycerides. In addition, alcohol is high in calories and low in nutrients. Ask for sparkling water, or a diet soft drink instead of an alcoholic beverage. Suggestions for planning and preparing meals   Bake, broil, grill or roast meats instead of frying.  Remove fat from meats and skin from poultry before cooking.  Add spices, herbs, lemon juice or vinegar to vegetables instead of salt, rich sauces or gravies.  Use a non-stick skillet without fat or use no-stick sprays.  Cool and refrigerate stews and broth. Then remove the hardened fat floating on the surface before serving.  Refrigerate meat drippings and skim off fat to make low-fat gravies.  Serve more fish.  Use less butter,   margarine and other high-fat spreads on bread or vegetables.  Use skim or reconstituted non-fat dry milk for cooking.  Cook with low-fat cheeses.  Substitute low-fat yogurt or cottage cheese for all or part of the sour cream in recipes for sauces, dips or congealed salads.  Use half yogurt/half mayonnaise in salad recipes.  Substitute evaporated skim milk for cream. Evaporated skim milk or reconstituted non-fat dry milk can be whipped and substituted for whipped cream in certain recipes.  Choose fresh fruits for dessert instead of high-fat foods such as pies or  cakes. Fruits are naturally low in fat. When Dining Out   Order low-fat appetizers such as fruit or vegetable juice, pasta with vegetables or tomato sauce.  Select clear, rather than cream soups.  Ask that dressings and gravies be served on the side. Then use less of them.  Order foods that are baked, broiled, poached, steamed, stir-fried, or roasted.  Ask for margarine instead of butter, and use only a small amount.  Drink sparkling water, unsweetened tea or coffee, or diet soft drinks instead of alcohol or other sweet beverages. QUESTIONS AND ANSWERS ABOUT OTHER FATS IN THE BLOOD: SATURATED FAT, TRANS FAT, AND CHOLESTEROL What is trans fat? Trans fat is a type of fat that is formed when vegetable oil is hardened through a process called hydrogenation. This process helps makes foods more solid, gives them shape, and prolongs their shelf life. Trans fats are also called hydrogenated or partially hydrogenated oils.  What do saturated fat, trans fat, and cholesterol in foods have to do with heart disease? Saturated fat, trans fat, and cholesterol in the diet all raise the level of LDL "bad" cholesterol in the blood. The higher the LDL cholesterol, the greater the risk for coronary heart disease (CHD). Saturated fat and trans fat raise LDL similarly.  What foods contain saturated fat, trans fat, and cholesterol? High amounts of saturated fat are found in animal products, such as fatty cuts of meat, chicken skin, and full-fat dairy products like butter, whole milk, cream, and cheese, and in tropical vegetable oils such as palm, palm kernel, and coconut oil. Trans fat is found in some of the same foods as saturated fat, such as vegetable shortening, some margarines (especially hard or stick margarine), crackers, cookies, baked goods, fried foods, salad dressings, and other processed foods made with partially hydrogenated vegetable oils. Small amounts of trans fat also occur naturally in some animal  products, such as milk products, beef, and lamb. Foods high in cholesterol include liver, other organ meats, egg yolks, shrimp, and full-fat dairy products. How can I use the new food label to make heart-healthy food choices? Check the Nutrition Facts panel of the food label. Choose foods lower in saturated fat, trans fat, and cholesterol. For saturated fat and cholesterol, you can also use the Percent Daily Value (%DV): 5% DV or less is low, and 20% DV or more is high. (There is no %DV for trans fat.) Use the Nutrition Facts panel to choose foods low in saturated fat and cholesterol, and if the trans fat is not listed, read the ingredients and limit products that list shortening or hydrogenated or partially hydrogenated vegetable oil, which tend to be high in trans fat. POINTS TO REMEMBER:   Discuss your risk for heart disease with your caregivers, and take steps to reduce risk factors.  Change your diet. Choose foods that are low in saturated fat, trans fat, and cholesterol.  Add exercise to your daily routine if   it is not already being done. Participate in physical activity of moderate intensity, like brisk walking, for at least 30 minutes on most, and preferably all days of the week. No time? Break the 30 minutes into three, 10-minute segments during the day.  Stop smoking. If you do smoke, contact your caregiver to discuss ways in which they can help you quit.  Do not use street drugs.  Maintain a normal weight.  Maintain a healthy blood pressure.  Keep up with your blood work for checking the fats in your blood as directed by your caregiver. Document Released: 05/23/2004 Document Revised: 02/04/2012 Document Reviewed: 12/19/2008 [employer] Patient Information 2014 [employer].  

## 2013-01-18 NOTE — Assessment & Plan Note (Signed)
His blood sugar and trigs are still high I have asked him to start novolog with each meal in addition to his other current meds

## 2013-01-18 NOTE — Addendum Note (Signed)
Addended by: Etta Grandchild on: 01/18/2013 04:22 PM   Modules accepted: Orders

## 2013-01-18 NOTE — Assessment & Plan Note (Signed)
Goal achieved 

## 2013-01-18 NOTE — Assessment & Plan Note (Signed)
His BP is well controlled 

## 2013-01-18 NOTE — Progress Notes (Signed)
Subjective:    Patient ID: [name] Foley, male    DOB: September 12, 1975, 37 y.o.   MRN: [id number]  Hyperlipidemia This is a chronic problem. The current episode started more than 1 year ago. Recent lipid tests were reviewed and are variable. Exacerbating diseases include diabetes and obesity. He has no history of chronic renal disease, hypothyroidism, liver disease or nephrotic syndrome. Factors aggravating his hyperlipidemia include thiazides. Pertinent negatives include no chest pain, focal sensory loss, focal weakness, leg pain, myalgias or shortness of breath. Current antihyperlipidemic treatment includes statins and diet change. The current treatment provides moderate improvement of lipids. Compliance problems include adherence to exercise and adherence to diet.       Review of Systems  Constitutional: Negative.  Negative for fever, chills, diaphoresis, activity change, appetite change, fatigue and unexpected weight change.  HENT: Negative.   Eyes: Negative.   Respiratory: Negative.  Negative for chest tightness, shortness of breath, wheezing and stridor.   Cardiovascular: Negative.  Negative for chest pain, palpitations and leg swelling.  Gastrointestinal: Negative for nausea, vomiting, abdominal pain, diarrhea, constipation and blood in stool.  Endocrine: Negative.   Genitourinary: Negative.   Musculoskeletal: Negative.  Negative for myalgias, back pain, joint swelling, arthralgias and gait problem.  Skin: Negative.   Allergic/Immunologic: Negative.   Neurological: Negative.  Negative for dizziness, tremors, focal weakness and weakness.  Hematological: Negative.  Negative for adenopathy. Does not bruise/bleed easily.  Psychiatric/Behavioral: Negative.        Objective:   Physical Exam  Vitals reviewed. Constitutional: He is oriented to person, place, and time. He appears well-developed and well-nourished. No distress.  HENT:  Head: Normocephalic and atraumatic.  Mouth/Throat:  Oropharynx is clear and moist. No oropharyngeal exudate.  Eyes: Conjunctivae are normal. Right eye exhibits no discharge. Left eye exhibits no discharge. No scleral icterus.  Neck: Normal range of motion. Neck supple. No JVD present. No tracheal deviation present. No thyromegaly present.  Cardiovascular: Normal rate, regular rhythm, normal heart sounds and intact distal pulses.  Exam reveals no gallop and no friction rub.   No murmur heard. Pulmonary/Chest: Effort normal and breath sounds normal. No stridor. No respiratory distress. He has no wheezes. He has no rales. He exhibits no tenderness.  Abdominal: Soft. Bowel sounds are normal. He exhibits no distension and no mass. There is no tenderness. There is no rebound and no guarding.  Musculoskeletal: Normal range of motion. He exhibits edema (1+ pitting edema in BLE). He exhibits no tenderness.  Lymphadenopathy:    He has no cervical adenopathy.  Neurological: He is oriented to person, place, and time.  Skin: Skin is warm and dry. No rash noted. He is not diaphoretic. No erythema. No pallor.  Psychiatric: He has a normal mood and affect. His behavior is normal. Judgment and thought content normal.     Lab Results  Component Value Date   WBC 8.6 11/19/2012   HGB 15.7 11/19/2012   HCT 46.0 11/19/2012   PLT 259.0 11/19/2012   GLUCOSE 469* 11/19/2012   CHOL 251* 11/19/2012   TRIG 1424.0 Verified by manual dilution. Triglyceride is over 400; calculations on Lipids are invalid.* 11/19/2012   HDL 35.40* 11/19/2012   LDLDIRECT 41.4 11/19/2012   LDLCALC  Value: 165        Total Cholesterol/HDL:CHD Risk Coronary Heart Disease Risk Table                     Men   Women  1/2 Average Risk  3.4   3.3* 10/24/2007   ALT 29 11/19/2012   AST 23 11/19/2012   NA 127* 11/19/2012   K 4.4 11/19/2012   CL 91* 11/19/2012   CREATININE 0.8 11/19/2012   BUN 14 11/19/2012   CO2 26 11/19/2012   TSH 0.63 11/19/2012   INR 1.0 RATIO 11/12/2007   HGBA1C 15.4 Repeated and verified X2.* 11/19/2012    MICROALBUR 2.9* 11/19/2012       Assessment & Plan:

## 2013-02-05 ENCOUNTER — Encounter: Payer: Self-pay | Admitting: Endocrinology

## 2013-02-05 ENCOUNTER — Ambulatory Visit (INDEPENDENT_AMBULATORY_CARE_PROVIDER_SITE_OTHER): Payer: BC Managed Care – PPO | Admitting: Endocrinology

## 2013-02-05 VITALS — BP 130/72 | HR 68 | Ht 73.0 in | Wt 266.0 lb

## 2013-02-05 MED ORDER — INSULIN DETEMIR 100 UNIT/ML FLEXPEN: 150.0000 [IU] | PEN_INJECTOR | SUBCUTANEOUS | Status: DC

## 2013-02-05 NOTE — Progress Notes (Signed)
Subjective:    Patient ID: [name] Foley, male    DOB: [birth year], 37 y.o.   MRN: [id number]  HPI pt states 14 years h/o dm.  He has severe neuropathy of the lower extremities.  pt says his diet is much better, and exercise is limited by health problems. He has severe pain at the LE's, and assoc numbness. He reports cbg's vary from 220-300's.   Past Medical History  Diagnosis Date  . CHF (congestive heart failure)     Secondary to ischemic cardiomyopathy  . CAD (coronary artery disease)     VERY Severe  . Morbid obesity   . Type II or unspecified type diabetes mellitus without mention of complication, not stated as uncontrolled   . Hyperlipidemia   . GERD (gastroesophageal reflux disease)     Past Surgical History  Procedure Laterality Date  . Coronary angioplasty with stent placement      History   Social History  . Marital Status: Married    Spouse Name: N/A    Number of Children: N/A  . Years of Education: N/A   Occupational History  . Not on file.   Social History Main Topics  . Smoking status: Former Smoker -- 1.50 packs/day for 20 years  . Smokeless tobacco: Never Used  . Alcohol Use: No  . Drug Use: Yes    Special: Marijuana  . Sexually Active: Yes    Birth Control/ Protection: Condom   Other Topics Concern  . Not on file   Social History Narrative   Regular Exercise -  NO    Current Outpatient Prescriptions on File Prior to Visit  Medication Sig Dispense Refill  . aspirin 325 MG tablet Take 325 mg by mouth daily.        Marland Kitchen atorvastatin (LIPITOR) 80 MG tablet Take 1 tablet (80 mg total) by mouth daily.  30 tablet  6  . bisoprolol (ZEBETA) 5 MG tablet Take 1 tablet (5 mg total) by mouth daily.  30 tablet  6  . Canagliflozin (INVOKANA) 300 MG TABS Take 300 mg by mouth daily.  70 tablet  0  . clopidogrel (PLAVIX) 75 MG tablet Take 1 tablet (75 mg total) by mouth daily.  30 tablet  6  . digoxin (LANOXIN) 0.25 MG tablet TAKE ONE TABLET BY MOUTH EVERY DAY   30 tablet  6  . fish oil-omega-3 fatty acids 1000 MG capsule Take 2 g by mouth daily.        . furosemide (LASIX) 40 MG tablet Take 1 tablet (40 mg total) by mouth 2 (two) times daily.  60 tablet  6  . glucose blood test strip 1 each 2 (two) times daily. Use as instructed       . Icosapent Ethyl (VASCEPA) 1 G CAPS Take 2 capsules by mouth 2 (two) times daily.  120 capsule  11  . insulin aspart (NOVOLOG FLEXPEN) 100 unit/mL SOLN FlexPen Inject 5 Units into the skin 3 (three) times daily with meals.  3 mL  11  . insulin detemir (LEVEMIR FLEXPEN) 100 UNIT/ML injection Inject 1 mL (100 Units total) into the skin at bedtime.  3 mL  6  . lisinopril (PRINIVIL,ZESTRIL) 10 MG tablet Take 1 tablet (10 mg total) by mouth 2 (two) times daily.  60 tablet  6  . Multiple Vitamins-Minerals (MENS MULTI VITAMIN & MINERAL PO) Take by mouth. 1 tablet daily.       . pantoprazole (PROTONIX) 40 MG tablet TAKE ONE TABLET BY  MOUTH EVERY DAY  30 tablet  0  . potassium chloride SA (K-DUR,KLOR-CON) 20 MEQ tablet Take 1 tablet (20 mEq total) by mouth daily.  30 tablet  6  . pregabalin (LYRICA) 50 MG capsule Take 1 capsule (50 mg total) by mouth 3 (three) times daily as needed.  84 capsule  0  . sitaGLIPtin (JANUVIA) 100 MG tablet Take 1 tablet (100 mg total) by mouth daily.  90 tablet  3  . spironolactone (ALDACTONE) 25 MG tablet Take 1 tablet (25 mg total) by mouth daily.  30 tablet  6  . colesevelam (WELCHOL) 625 MG tablet Take 3 tablets (1,875 mg total) by mouth 2 (two) times daily with a meal.  180 tablet  11   No current facility-administered medications on file prior to visit.    Allergies  Allergen Reactions  . Metformin     REACTION: diarrhea; severe nausea and abdominal cramping    Family History  Problem Relation Age of Onset  . Hyperlipidemia Mother   . Hypertension Father   . Diabetes Father   . Heart disease Father   . Cancer Neg Hx   . COPD Neg Hx   . Drug abuse Neg Hx   . Hearing loss Neg Hx   .  Kidney disease Neg Hx   . Stroke Neg Hx   . Alcohol abuse Neg Hx    BP 130/72  Pulse 68  Ht 6\' 1"  (1.854 m)  Wt 266 lb (120.657 kg)  BMI 35.1 kg/m2  SpO2 98%  Review of Systems denies blurry vision, chest pain, sob, n/v, urinary frequency, cramps, excessive diaphoresis, memory loss, and hypoglycemia.  He has lost weight over the years.  He has chronic headache, rhinorrhea, easy bruising, and depression.     Objective:   Physical Exam VS: see vs page GEN: no distress HEAD: head: no deformity eyes: no periorbital swelling, no proptosis external nose and ears are normal mouth: no lesion seen NECK: supple, thyroid is not enlarged CHEST WALL: no deformity LUNGS: clear to auscultation CV: reg rate and rhythm, no murmur ABD: abdomen is soft, nontender.  no hepatosplenomegaly.  not distended.  no hernia MUSCULOSKELETAL: muscle bulk and strength are grossly normal.  no obvious joint swelling.  gait is normal and steady. PULSES: no carotid bruit NEURO:  cn 2-12 grossly intact.   readily moves all 4's.   SKIN:  Normal texture and temperature.  No rash or suspicious lesion is visible.   NODES:  None palpable at the neck PSYCH: alert, oriented x3.  Does not appear anxious nor depressed.  Lab Results  Component Value Date   HGBA1C 15.4 Repeated and verified X2.* 11/19/2012      Assessment & Plan:  DM: he needs increased rx.  Given severe hypoglycemia, we should first improve control with a simple regimen. Painful neuropathy.  This limits exercise rx of DM Depression.  This limits rx of DM.

## 2013-02-05 NOTE — Patient Instructions (Addendum)
good diet and exercise habits significanly improve the control of your diabetes.  please let me know if you wish to be referred to a dietician.  high blood sugar is very risky to your health.  you should see an eye doctor every year.  You are at higher than average risk for pneumonia and hepatitis-B.  You should be vaccinated against both.   controlling your blood pressure and cholesterol drastically reduces the damage diabetes does to your body.  this also applies to quitting smoking.  please discuss these with your doctor.  check your blood sugar twice a day.  vary the time of day when you check, between before the 3 meals, and at bedtime.  also check if you have symptoms of your blood sugar being too high or too low.  please keep a record of the readings and bring it to your next appointment here.  please call us sooner if your blood sugar goes below 70, or if you have a lot of readings over 200. we will need to take this complex situation in stages. For now, please hold-off on the novolog and invokana.   Also, please change the levemir to 150 units each morning. Please come back for a follow-up appointment in 1 month.

## 2013-02-08 ENCOUNTER — Telehealth: Payer: Self-pay

## 2013-02-08 NOTE — Telephone Encounter (Signed)
Please increase levemir to 180 units qam. Call in a few days if blood sugar stays over 200. Drink plenty of fluids

## 2013-02-08 NOTE — Telephone Encounter (Signed)
Called pt and advised him to please increase levemir to 180 units qam. Call in a few days if blood sugar stays over 200 and drink plenty of fluids. Pt understood.

## 2013-02-08 NOTE — Telephone Encounter (Signed)
Pt was seen on Friday, wanted to let you know that his cbg has been in 400's, please advise [phone]

## 2013-02-09 ENCOUNTER — Other Ambulatory Visit (HOSPITAL_COMMUNITY): Payer: Self-pay | Admitting: Internal Medicine

## 2013-02-09 ENCOUNTER — Ambulatory Visit (HOSPITAL_BASED_OUTPATIENT_CLINIC_OR_DEPARTMENT_OTHER)
Admission: RE | Admit: 2013-02-09 | Discharge: 2013-02-09 | Disposition: A | Discharge status: Home / Self Care | Payer: BC Managed Care – PPO | Source: Ambulatory Visit | Attending: Internal Medicine | Admitting: Internal Medicine

## 2013-02-09 ENCOUNTER — Ambulatory Visit (HOSPITAL_COMMUNITY)
Admission: RE | Admit: 2013-02-09 | Discharge: 2013-02-09 | Disposition: A | Discharge status: Home / Self Care | Payer: BC Managed Care – PPO | Source: Ambulatory Visit | Attending: Internal Medicine | Admitting: Internal Medicine

## 2013-02-09 VITALS — BP 130/78 | HR 74 | Wt 265.0 lb

## 2013-02-09 DIAGNOSIS — I5022 Chronic systolic (congestive) heart failure: Secondary | ICD-10-CM

## 2013-02-09 DIAGNOSIS — K219 Gastro-esophageal reflux disease without esophagitis: Secondary | ICD-10-CM | POA: Insufficient documentation

## 2013-02-09 DIAGNOSIS — Z794 Long term (current) use of insulin: Secondary | ICD-10-CM | POA: Insufficient documentation

## 2013-02-09 DIAGNOSIS — E119 Type 2 diabetes mellitus without complications: Secondary | ICD-10-CM | POA: Insufficient documentation

## 2013-02-09 DIAGNOSIS — I2510 Atherosclerotic heart disease of native coronary artery without angina pectoris: Secondary | ICD-10-CM

## 2013-02-09 DIAGNOSIS — E785 Hyperlipidemia, unspecified: Secondary | ICD-10-CM | POA: Insufficient documentation

## 2013-02-09 DIAGNOSIS — I509 Heart failure, unspecified: Secondary | ICD-10-CM

## 2013-02-09 DIAGNOSIS — I10 Essential (primary) hypertension: Secondary | ICD-10-CM | POA: Insufficient documentation

## 2013-02-09 DIAGNOSIS — I2589 Other forms of chronic ischemic heart disease: Secondary | ICD-10-CM | POA: Insufficient documentation

## 2013-02-09 DIAGNOSIS — Z7982 Long term (current) use of aspirin: Secondary | ICD-10-CM | POA: Insufficient documentation

## 2013-02-09 DIAGNOSIS — I517 Cardiomegaly: Secondary | ICD-10-CM

## 2013-02-09 DIAGNOSIS — Z79899 Other long term (current) drug therapy: Secondary | ICD-10-CM | POA: Insufficient documentation

## 2013-02-09 MED ORDER — BISOPROLOL FUMARATE 10 MG PO TABS: 10.0000 mg | ORAL_TABLET | Freq: Every day | ORAL | Status: DC

## 2013-02-09 NOTE — Progress Notes (Signed)
Patient ID: [name] Foley, male   DOB: 02-07-1976, 37 y.o.   MRN: [id number] PCP: Dr Yetta Barre  HPI:  [name] Foley is a 37 year old male with a history of morbid obesity, hypertension, diabetes, and congestive heart failure secondary to severe ischemic cardiomyopathy with ejection fraction of 20% previously.  Cardiac catheterization 2009 year showed severe three- vessel coronary artery disease which was not amenable to bypass surgery due to severe distal disease.  He did undergo angioplasty of a portion of his LAD.   11/2009 Echo  EF 40%. 04/2011 Echo EF 20-25% 02/13/12 ECHO EF 30-35% 02/09/13 ECHO EF ~ 40-45% Dr [name] reviewed personally   He returns for follow up today. Last visit lisinopril was cut back to 10 mg bid due to hypotension Feels really good. Denies SOB/PND/Orthopnea. Weight 255 pounds. Compliant with medications.      ROS: All systems negative except as listed in HPI, PMH and Problem List.  Past Medical History  Diagnosis Date  . CHF (congestive heart failure)     Secondary to ischemic cardiomyopathy  . CAD (coronary artery disease)     VERY Severe  . Morbid obesity   . Type II or unspecified type diabetes mellitus without mention of complication, not stated as uncontrolled   . Hyperlipidemia   . GERD (gastroesophageal reflux disease)     Current Outpatient Prescriptions  Medication Sig Dispense Refill  . aspirin 325 MG tablet Take 325 mg by mouth daily.        Marland Kitchen atorvastatin (LIPITOR) 80 MG tablet Take 1 tablet (80 mg total) by mouth daily.  30 tablet  6  . clopidogrel (PLAVIX) 75 MG tablet Take 1 tablet (75 mg total) by mouth daily.  30 tablet  6  . digoxin (LANOXIN) 0.25 MG tablet TAKE ONE TABLET BY MOUTH EVERY DAY  30 tablet  6  . fish oil-omega-3 fatty acids 1000 MG capsule Take 2 g by mouth daily.        . furosemide (LASIX) 40 MG tablet Take 1 tablet (40 mg total) by mouth 2 (two) times daily.  60 tablet  6  . glucose blood test strip 1 each 2 (two) times daily. Use as  instructed       . Icosapent Ethyl (VASCEPA) 1 G CAPS Take 2 capsules by mouth 2 (two) times daily.  120 capsule  11  . Insulin Detemir (LEVEMIR FLEXPEN) 100 UNIT/ML SOPN Inject 150 Units into the skin every morning.  20 pen  11  . lisinopril (PRINIVIL,ZESTRIL) 10 MG tablet Take 1 tablet (10 mg total) by mouth 2 (two) times daily.  60 tablet  6  . Multiple Vitamins-Minerals (MENS MULTI VITAMIN & MINERAL PO) Take by mouth. 1 tablet daily.       . pantoprazole (PROTONIX) 40 MG tablet TAKE ONE TABLET BY MOUTH EVERY DAY  30 tablet  0  . potassium chloride SA (K-DUR,KLOR-CON) 20 MEQ tablet Take 1 tablet (20 mEq total) by mouth daily.  30 tablet  6  . pregabalin (LYRICA) 50 MG capsule Take 1 capsule (50 mg total) by mouth 3 (three) times daily as needed.  84 capsule  0  . sitaGLIPtin (JANUVIA) 100 MG tablet Take 1 tablet (100 mg total) by mouth daily.  90 tablet  3  . spironolactone (ALDACTONE) 25 MG tablet Take 1 tablet (25 mg total) by mouth daily.  30 tablet  6  . bisoprolol (ZEBETA) 10 MG tablet Take 1 tablet (10 mg total) by mouth daily.  30 tablet  6  . colesevelam (WELCHOL) 625 MG tablet Take 3 tablets (1,875 mg total) by mouth 2 (two) times daily with a meal.  180 tablet  11   No current facility-administered medications for this encounter.     PHYSICAL EXAM Filed Vitals:   02/09/13 1555  BP: 130/78  Pulse: 74  Weight: 265 lb (120.203 kg)  SpO2: 100%   General:  well appearing. no resp difficulty HEENT: normal Neck: supple.  JVP 5-6. Carotids 2+ bilat; no bruits. No lymphadenopathy or thryomegaly appreciated. Cor: PMI nondisplaced. Regular rate & rhythm. No rubs, gallops, murmur. Lungs: clear Abdomen: obese, soft, nontender, nondistended. No hepatosplenomegaly. No bruits or masses. Good bowel sounds. Extremities: no cyanosis, clubbing, rash,  edema Neuro: alert & orientedx3, cranial nerves grossly intact. moves all 4 extremities w/o difficulty. affect normal   ASSESSMENT &  PLAN:

## 2013-02-09 NOTE — Assessment & Plan Note (Addendum)
Dr [name] discussed reviewed ECHO. EF improved ~ 40-45%. Increase bisoprolol to 10 mg daily. Reinforced daily weights, low salt food choices, and medication compliance. Follow up in 3 months  Patient seen and examined with Tonye Becket, NP. We discussed all aspects of the encounter. I agree with the assessment and plan as stated above. I have reviewed echo personally and EF has improved again. Now 40-45%. He is NYHA II. Volume status looks good. Increase bisoprolol. Unable to tolerate higher doses of ACE previously.

## 2013-02-09 NOTE — Progress Notes (Signed)
Echocardiogram 2D Echocardiogram has been performed.  [name] Foley 02/09/2013, 4:34 PM

## 2013-02-09 NOTE — Assessment & Plan Note (Signed)
No evidence of ischemia. Continue current regimen.   

## 2013-02-11 ENCOUNTER — Other Ambulatory Visit: Payer: Self-pay

## 2013-02-11 MED ORDER — PANTOPRAZOLE SODIUM 40 MG PO TBEC: DELAYED_RELEASE_TABLET | ORAL | Status: DC

## 2013-03-01 ENCOUNTER — Telehealth: Payer: Self-pay | Admitting: Endocrinology

## 2013-03-01 MED ORDER — INSULIN DETEMIR 100 UNIT/ML FLEXPEN: 150.0000 [IU] | PEN_INJECTOR | SUBCUTANEOUS | Status: DC

## 2013-03-01 NOTE — Telephone Encounter (Signed)
Spoke with pharmacist to verify that pt is taking 150 units Levemir and rx was sent in for pen needles

## 2013-03-01 NOTE — Telephone Encounter (Signed)
Pt needs refill on Levamir pen d/t dosage increase and end cap needles for the pen / Sherri S. Per pt.Guage - 29 gauge/12 mm for the needles

## 2013-03-09 ENCOUNTER — Ambulatory Visit: Payer: BC Managed Care – PPO | Admitting: Endocrinology

## 2013-03-11 ENCOUNTER — Telehealth: Payer: Self-pay | Admitting: *Deleted

## 2013-03-11 NOTE — Telephone Encounter (Signed)
Pt called requesting refill samples of Lyrica.  Please advise

## 2013-03-11 NOTE — Telephone Encounter (Signed)
Ok with me 

## 2013-03-11 NOTE — Telephone Encounter (Signed)
Advised pt samples at the desk.

## 2013-03-29 ENCOUNTER — Telehealth: Payer: Self-pay | Admitting: Endocrinology

## 2013-03-29 MED ORDER — INSULIN DETEMIR 100 UNIT/ML FLEXPEN: 150.0000 [IU] | PEN_INJECTOR | SUBCUTANEOUS | Status: DC

## 2013-04-01 ENCOUNTER — Ambulatory Visit (INDEPENDENT_AMBULATORY_CARE_PROVIDER_SITE_OTHER): Payer: BC Managed Care – PPO | Admitting: Endocrinology

## 2013-04-01 ENCOUNTER — Encounter: Payer: Self-pay | Admitting: Endocrinology

## 2013-04-01 VITALS — BP 138/80 | HR 80 | Ht 73.0 in | Wt 270.0 lb

## 2013-04-01 MED ORDER — INSULIN DETEMIR 100 UNIT/ML FLEXPEN: 200.0000 [IU] | PEN_INJECTOR | SUBCUTANEOUS | Status: DC

## 2013-04-01 NOTE — Progress Notes (Signed)
Subjective:    Patient ID: [name] Foley, male    DOB: 09-21-1975, 37 y.o.   MRN: [id number]  HPI pt returns for f/u of type 2 DM (dx'ed 2000; he has severe neuropathy of the lower extremities, but no associated complications; he has requested a simple qd insulin regimen).  no cbg record, but states cbg's vary from 131-200's.  It is in general higher as the day goes on.  pt states he feels well in general.   Past Medical History  Diagnosis Date  . CHF (congestive heart failure)     Secondary to ischemic cardiomyopathy  . CAD (coronary artery disease)     VERY Severe  . Morbid obesity   . Type II or unspecified type diabetes mellitus without mention of complication, not stated as uncontrolled   . Hyperlipidemia   . GERD (gastroesophageal reflux disease)     Past Surgical History  Procedure Laterality Date  . Coronary angioplasty with stent placement      History   Social History  . Marital Status: Married    Spouse Name: N/A    Number of Children: N/A  . Years of Education: N/A   Occupational History  . Not on file.   Social History Main Topics  . Smoking status: Former Smoker -- 1.50 packs/day for 20 years  . Smokeless tobacco: Never Used  . Alcohol Use: No  . Drug Use: Yes    Special: Marijuana  . Sexual Activity: Yes    Birth Control/ Protection: Condom   Other Topics Concern  . Not on file   Social History Narrative   Regular Exercise -  NO    Current Outpatient Prescriptions on File Prior to Visit  Medication Sig Dispense Refill  . aspirin 325 MG tablet Take 325 mg by mouth daily.        Marland Kitchen atorvastatin (LIPITOR) 80 MG tablet Take 1 tablet (80 mg total) by mouth daily.  30 tablet  6  . bisoprolol (ZEBETA) 10 MG tablet Take 1 tablet (10 mg total) by mouth daily.  30 tablet  6  . clopidogrel (PLAVIX) 75 MG tablet Take 1 tablet (75 mg total) by mouth daily.  30 tablet  6  . digoxin (LANOXIN) 0.25 MG tablet TAKE ONE TABLET BY MOUTH EVERY DAY  30 tablet  6  .  fish oil-omega-3 fatty acids 1000 MG capsule Take 2 g by mouth daily.        . furosemide (LASIX) 40 MG tablet Take 1 tablet (40 mg total) by mouth 2 (two) times daily.  60 tablet  6  . glucose blood test strip 1 each 2 (two) times daily. Use as instructed       . Icosapent Ethyl (VASCEPA) 1 G CAPS Take 2 capsules by mouth 2 (two) times daily.  120 capsule  11  . lisinopril (PRINIVIL,ZESTRIL) 10 MG tablet Take 1 tablet (10 mg total) by mouth 2 (two) times daily.  60 tablet  6  . Multiple Vitamins-Minerals (MENS MULTI VITAMIN & MINERAL PO) Take by mouth. 1 tablet daily.       . pantoprazole (PROTONIX) 40 MG tablet TAKE ONE TABLET BY MOUTH EVERY DAY  30 tablet  3  . potassium chloride SA (K-DUR,KLOR-CON) 20 MEQ tablet Take 1 tablet (20 mEq total) by mouth daily.  30 tablet  6  . pregabalin (LYRICA) 50 MG capsule Take 1 capsule (50 mg total) by mouth 3 (three) times daily as needed.  84 capsule  0  .  sitaGLIPtin (JANUVIA) 100 MG tablet Take 1 tablet (100 mg total) by mouth daily.  90 tablet  3  . spironolactone (ALDACTONE) 25 MG tablet Take 1 tablet (25 mg total) by mouth daily.  30 tablet  6  . colesevelam (WELCHOL) 625 MG tablet Take 3 tablets (1,875 mg total) by mouth 2 (two) times daily with a meal.  180 tablet  11   No current facility-administered medications on file prior to visit.    Allergies  Allergen Reactions  . Metformin     REACTION: diarrhea; severe nausea and abdominal cramping    Family History  Problem Relation Age of Onset  . Hyperlipidemia Mother   . Hypertension Father   . Diabetes Father   . Heart disease Father   . Cancer Neg Hx   . COPD Neg Hx   . Drug abuse Neg Hx   . Hearing loss Neg Hx   . Kidney disease Neg Hx   . Stroke Neg Hx   . Alcohol abuse Neg Hx    BP 138/80  Pulse 80  Ht 6\' 1"  (1.854 m)  Wt 270 lb (122.471 kg)  BMI 35.63 kg/m2  SpO2 98%  Review of Systems denies hypoglycemia and weight change.      Objective:   Physical Exam VITAL  SIGNS:  See vs page GENERAL: no distress SKIN:  Insulin injection sites at the anterior abdomen are normal.  Redundant skin is noted on the abdomen.       Assessment & Plan:  DM: This insulin regimen was chosen from multiple options, for its simplicity.  The benefits of glycemic control must be weighed against the risks of hypoglycemia.  He needs increased rx

## 2013-04-01 NOTE — Patient Instructions (Addendum)
check your blood sugar twice a day.  vary the time of day when you check, between before the 3 meals, and at bedtime.  also check if you have symptoms of your blood sugar being too high or too low.  please keep a record of the readings and bring it to your next appointment here.  please call us sooner if your blood sugar goes below 70, or if you have a lot of readings over 200. please increase the levemir to 200 units each morning. Please come back for a follow-up appointment in 1 month.

## 2013-04-20 ENCOUNTER — Ambulatory Visit (HOSPITAL_COMMUNITY)
Admission: RE | Admit: 2013-04-20 | Discharge: 2013-04-20 | Disposition: A | Discharge status: Home / Self Care | Payer: BC Managed Care – PPO | Source: Ambulatory Visit | Attending: Internal Medicine | Admitting: Internal Medicine

## 2013-04-20 ENCOUNTER — Telehealth: Payer: Self-pay | Admitting: Endocrinology

## 2013-04-20 VITALS — BP 102/44 | HR 63 | Wt 273.0 lb

## 2013-04-20 DIAGNOSIS — Z79899 Other long term (current) drug therapy: Secondary | ICD-10-CM | POA: Insufficient documentation

## 2013-04-20 DIAGNOSIS — Z7982 Long term (current) use of aspirin: Secondary | ICD-10-CM | POA: Insufficient documentation

## 2013-04-20 DIAGNOSIS — I2510 Atherosclerotic heart disease of native coronary artery without angina pectoris: Secondary | ICD-10-CM

## 2013-04-20 DIAGNOSIS — I5022 Chronic systolic (congestive) heart failure: Secondary | ICD-10-CM | POA: Insufficient documentation

## 2013-04-20 DIAGNOSIS — Z7902 Long term (current) use of antithrombotics/antiplatelets: Secondary | ICD-10-CM | POA: Insufficient documentation

## 2013-04-20 DIAGNOSIS — I2589 Other forms of chronic ischemic heart disease: Secondary | ICD-10-CM | POA: Insufficient documentation

## 2013-04-20 DIAGNOSIS — I509 Heart failure, unspecified: Secondary | ICD-10-CM | POA: Insufficient documentation

## 2013-04-20 DIAGNOSIS — K219 Gastro-esophageal reflux disease without esophagitis: Secondary | ICD-10-CM | POA: Insufficient documentation

## 2013-04-20 DIAGNOSIS — E119 Type 2 diabetes mellitus without complications: Secondary | ICD-10-CM | POA: Insufficient documentation

## 2013-04-20 DIAGNOSIS — E785 Hyperlipidemia, unspecified: Secondary | ICD-10-CM | POA: Insufficient documentation

## 2013-04-20 NOTE — Telephone Encounter (Signed)
Levemir not on med list, please advise of dosage

## 2013-04-20 NOTE — Telephone Encounter (Signed)
Please refill prn 

## 2013-04-20 NOTE — Telephone Encounter (Signed)
Insulin dosage changed, needs new script called in to reflect increase of Levamir flex Pen. Uses [facility] / Sherri S.

## 2013-04-20 NOTE — Patient Instructions (Addendum)
Follow up in 3 months  Take an extra 40 mg lasix today and tomorrow.   Do the following things EVERYDAY: 1) Weigh yourself in the morning before breakfast. Write it down and keep it in a log. 2) Take your medicines as prescribed 3) Eat low salt foods-Limit salt (sodium) to 2000 mg per day.  4) Stay as active as you can everyday 5) Limit all fluids for the day to less than 2 liters

## 2013-04-20 NOTE — Progress Notes (Signed)
Patient ID: [name] Foley, male   DOB: 01-Jun-1976, 37 y.o.   MRN: [id number] PCP: Dr Yetta Barre Endocrinologist: Dr Everardo All  HPI: [name] Foley is a 37 year old male with a history of morbid obesity, hypertension, diabetes, and congestive heart failure secondary to severe ischemic cardiomyopathy with ejection fraction of 20% previously.  Cardiac catheterization 2009 year showed severe three- vessel coronary artery disease which was not amenable to bypass surgery due to severe distal disease.  He did undergo angioplasty of a portion of his LAD.   11/2009 Echo  EF 40%. 04/2011 Echo EF 20-25% 02/13/12 ECHO EF 30-35% 02/09/13 ECHO EF ~ 40-45% Dr [name] reviewed personally   He returns for follow up today. Last visit bisoprolol was increased to 10 mg daily but he did not tolerate and he cut it back to 5 mg daily. Denies SOB/PND/Orthopnea. Weight 250-258 pounds. He has not taken additional lasix. Blood sugar better controlled so he has not been as thirsty. Compliant with medications.  Able to walk more with lyrica.    ROS: All systems negative except as listed in HPI, PMH and Problem List.  Past Medical History  Diagnosis Date  . CHF (congestive heart failure)     Secondary to ischemic cardiomyopathy  . CAD (coronary artery disease)     VERY Severe  . Morbid obesity   . Type II or unspecified type diabetes mellitus without mention of complication, not stated as uncontrolled   . Hyperlipidemia   . GERD (gastroesophageal reflux disease)     Current Outpatient Prescriptions  Medication Sig Dispense Refill  . aspirin 325 MG tablet Take 325 mg by mouth daily.        Marland Kitchen atorvastatin (LIPITOR) 80 MG tablet Take 1 tablet (80 mg total) by mouth daily.  30 tablet  6  . bisoprolol (ZEBETA) 10 MG tablet Take 5 mg by mouth daily.      . clopidogrel (PLAVIX) 75 MG tablet Take 1 tablet (75 mg total) by mouth daily.  30 tablet  6  . digoxin (LANOXIN) 0.25 MG tablet TAKE ONE TABLET BY MOUTH EVERY DAY  30 tablet  6  .  fish oil-omega-3 fatty acids 1000 MG capsule Take 2 g by mouth daily.        . furosemide (LASIX) 40 MG tablet Take 1 tablet (40 mg total) by mouth 2 (two) times daily.  60 tablet  6  . glucose blood test strip 1 each 2 (two) times daily. Use as instructed       . Icosapent Ethyl (VASCEPA) 1 G CAPS Take 2 capsules by mouth 2 (two) times daily.  120 capsule  11  . Insulin Detemir 100 UNIT/ML SOPN Inject 200 Units into the skin every morning.  25 pen  11  . lisinopril (PRINIVIL,ZESTRIL) 10 MG tablet Take 1 tablet (10 mg total) by mouth 2 (two) times daily.  60 tablet  6  . Multiple Vitamins-Minerals (MENS MULTI VITAMIN & MINERAL PO) Take by mouth. 1 tablet daily.       . pantoprazole (PROTONIX) 40 MG tablet TAKE ONE TABLET BY MOUTH EVERY DAY  30 tablet  3  . potassium chloride SA (K-DUR,KLOR-CON) 20 MEQ tablet Take 1 tablet (20 mEq total) by mouth daily.  30 tablet  6  . pregabalin (LYRICA) 50 MG capsule Take 1 capsule (50 mg total) by mouth 3 (three) times daily as needed.  84 capsule  0  . spironolactone (ALDACTONE) 25 MG tablet Take 1 tablet (25 mg total) by mouth  daily.  30 tablet  6  . colesevelam (WELCHOL) 625 MG tablet Take 3 tablets (1,875 mg total) by mouth 2 (two) times daily with a meal.  180 tablet  11   No current facility-administered medications for this encounter.     PHYSICAL EXAM Filed Vitals:   04/20/13 1159  BP: 102/44  Pulse: 63  Weight: 273 lb (123.832 kg)  SpO2: 100%   General:  well appearing. no resp difficulty HEENT: normal Neck: supple.  JVP 7-8. Carotids 2+ bilat; no bruits. No lymphadenopathy or thryomegaly appreciated. Cor: PMI nondisplaced. Regular rate & rhythm. No rubs, gallops, murmur. Lungs: clear Abdomen: obese, soft, nontender, nondistended. No hepatosplenomegaly. No bruits or masses. Good bowel sounds. Extremities: no cyanosis, clubbing, rash,  Trace RLE and LLE edema Neuro: alert & orientedx3, cranial nerves grossly intact. moves all 4 extremities  w/o difficulty. affect normal   ASSESSMENT & PLAN: 1. Chronic Systolic Heart Failure ECHO 01/2013 EF 40-45% NYHA II Volume status mildly elevated. Continue lasix 40 mg twice a day. Instructed to take additional 40 mg of lasix today. Continue spironolactone 25 mg daily Continue bisoprolol 5 mg daily as he was intolerant of increase to 10 mg daily. Continue digoxin 0.25 mg daily. Continue lisinopril 10 mg twice a day Reinforced daily weights, low salt food choices, limiting fluid intake and medication compliance.    2. CAD- Cardiac catheterization 2009 year showed severe three- vessel coronary artery disease which was not amenable to bypass surgery due to severe distal disease. Angioplasty of a portion of his LAD.  No evidence of ischemia Continue ASA and statin.  3. DM -  Followed by Dr Everardo All.    Follow up in 3 months  CLEGG,AMY NP-C 12:16 PM  Patient seen and examined with Tonye Becket, NP. We discussed all aspects of the encounter. I agree with the assessment and plan as stated above. Overall doing well. NYHA II. Unable to tolerate higher doses of bisoprolol. Reinforced need for daily weights and reviewed use of sliding scale diuretics. Continue to refuse ICD.  Daniel Bensimhon,MD 9:50 PM

## 2013-04-21 ENCOUNTER — Other Ambulatory Visit: Payer: Self-pay

## 2013-04-21 ENCOUNTER — Telehealth: Payer: Self-pay | Admitting: Endocrinology

## 2013-04-21 ENCOUNTER — Ambulatory Visit (INDEPENDENT_AMBULATORY_CARE_PROVIDER_SITE_OTHER): Payer: Medicare Other

## 2013-04-21 ENCOUNTER — Encounter: Payer: Self-pay | Admitting: Internal Medicine

## 2013-04-21 ENCOUNTER — Ambulatory Visit (INDEPENDENT_AMBULATORY_CARE_PROVIDER_SITE_OTHER): Payer: Medicare Other | Admitting: Internal Medicine

## 2013-04-21 VITALS — BP 126/68 | HR 57 | Temp 97.8°F | Resp 16 | Wt 274.0 lb

## 2013-04-21 DIAGNOSIS — E1142 Type 2 diabetes mellitus with diabetic polyneuropathy: Secondary | ICD-10-CM

## 2013-04-21 DIAGNOSIS — I10 Essential (primary) hypertension: Secondary | ICD-10-CM

## 2013-04-21 DIAGNOSIS — Z23 Encounter for immunization: Secondary | ICD-10-CM

## 2013-04-21 DIAGNOSIS — E781 Pure hyperglyceridemia: Secondary | ICD-10-CM

## 2013-04-21 DIAGNOSIS — E1149 Type 2 diabetes mellitus with other diabetic neurological complication: Secondary | ICD-10-CM

## 2013-04-21 DIAGNOSIS — E1140 Type 2 diabetes mellitus with diabetic neuropathy, unspecified: Secondary | ICD-10-CM

## 2013-04-21 DIAGNOSIS — I70219 Atherosclerosis of native arteries of extremities with intermittent claudication, unspecified extremity: Secondary | ICD-10-CM

## 2013-04-21 DIAGNOSIS — F1220 Cannabis dependence, uncomplicated: Secondary | ICD-10-CM

## 2013-04-21 LAB — BASIC METABOLIC PANEL
BUN: 17 mg/dL (ref 6–23)
Chloride: 100 mEq/L (ref 96–112)
Potassium: 4.1 mEq/L (ref 3.5–5.1)

## 2013-04-21 LAB — HEMOGLOBIN A1C: Hgb A1c MFr Bld: 9.7 % — ABNORMAL HIGH (ref 4.6–6.5)

## 2013-04-21 LAB — LIPID PANEL
Cholesterol: 125 mg/dL (ref 0–200)
HDL: 27.1 mg/dL — ABNORMAL LOW (ref >39.00)
VLDL: 32.8 mg/dL (ref 0.0–40.0)

## 2013-04-21 MED ORDER — CANAGLIFLOZIN 300 MG PO TABS: 1.0000 | ORAL_TABLET | Freq: Every day | ORAL | Status: DC

## 2013-04-21 MED ORDER — INSULIN DETEMIR 100 UNIT/ML FLEXPEN: 200.0000 [IU] | PEN_INJECTOR | SUBCUTANEOUS | Status: DC

## 2013-04-21 NOTE — Assessment & Plan Note (Signed)
lyrica has helped this quite a bit

## 2013-04-21 NOTE — Patient Instructions (Signed)
Type 2 Diabetes Mellitus, Adult Type 2 diabetes mellitus, often simply referred to as type 2 diabetes, is a long-lasting (chronic) disease. In type 2 diabetes, the pancreas does not make enough insulin (a hormone), the cells are less responsive to the insulin that is made (insulin resistance), or both. Normally, insulin moves sugars from food into the tissue cells. The tissue cells use the sugars for energy. The lack of insulin or the lack of normal response to insulin causes excess sugars to build up in the blood instead of going into the tissue cells. As a result, high blood sugar (hyperglycemia) develops. The effect of high sugar (glucose) levels can cause many complications. Type 2 diabetes was also previously called adult-onset diabetes but it can occur at any age.  RISK FACTORS  A person is predisposed to developing type 2 diabetes if someone in the family has the disease and also has one or more of the following primary risk factors:  Overweight.  An inactive lifestyle.  A history of consistently eating high-calorie foods. Maintaining a normal weight and regular physical activity can reduce the chance of developing type 2 diabetes. SYMPTOMS  A person with type 2 diabetes may not show symptoms initially. The symptoms of type 2 diabetes appear slowly. The symptoms include:  Increased thirst (polydipsia).  Increased urination (polyuria).  Increased urination during the night (nocturia).  Weight loss. This weight loss may be rapid.  Frequent, recurring infections.  Tiredness (fatigue).  Weakness.  Vision changes, such as blurred vision.  Fruity smell to your breath.  Abdominal pain.  Nausea or vomiting.  Cuts or bruises which are slow to heal.  Tingling or numbness in the hands or feet. DIAGNOSIS Type 2 diabetes is frequently not diagnosed until complications of diabetes are present. Type 2 diabetes is diagnosed when symptoms or complications are present and when blood  glucose levels are increased. Your blood glucose level may be checked by one or more of the following blood tests:  A fasting blood glucose test. You will not be allowed to eat for at least 8 hours before a blood sample is taken.  A random blood glucose test. Your blood glucose is checked at any time of the day regardless of when you ate.  A hemoglobin A1c blood glucose test. A hemoglobin A1c test provides information about blood glucose control over the previous 3 months.  An oral glucose tolerance test (OGTT). Your blood glucose is measured after you have not eaten (fasted) for 2 hours and then after you drink a glucose-containing beverage. TREATMENT   You may need to take insulin or diabetes medicine daily to keep blood glucose levels in the desired range.  You will need to match insulin dosing with exercise and healthy food choices. The treatment goal is to maintain the before meal blood sugar (preprandial glucose) level at 70 130 mg/dL. HOME CARE INSTRUCTIONS   Have your hemoglobin A1c level checked twice a year.  Perform daily blood glucose monitoring as directed by your caregiver.  Monitor urine ketones when you are ill and as directed by your caregiver.  Take your diabetes medicine or insulin as directed by your caregiver to maintain your blood glucose levels in the desired range.  Never run out of diabetes medicine or insulin. It is needed every day.  Adjust insulin based on your intake of carbohydrates. Carbohydrates can raise blood glucose levels but need to be included in your diet. Carbohydrates provide vitamins, minerals, and fiber which are an essential part of   a healthy diet. Carbohydrates are found in fruits, vegetables, whole grains, dairy products, legumes, and foods containing added sugars.    Eat healthy foods. Alternate 3 meals with 3 snacks.  Lose weight if overweight.  Carry a medical alert card or wear your medical alert jewelry.  Carry a 15 gram  carbohydrate snack with you at all times to treat low blood glucose (hypoglycemia). Some examples of 15 gram carbohydrate snacks include:  Glucose tablets, 3 or 4   Glucose gel, 15 gram tube  Raisins, 2 tablespoons (24 grams)  Jelly beans, 6  Animal crackers, 8  Regular pop, 4 ounces (120 mL)  Gummy treats, 9  Recognize hypoglycemia. Hypoglycemia occurs with blood glucose levels of 70 mg/dL and below. The risk for hypoglycemia increases when fasting or skipping meals, during or after intense exercise, and during sleep. Hypoglycemia symptoms can include:  Tremors or shakes.  Decreased ability to concentrate.  Sweating.  Increased heart rate.  Headache.  Dry mouth.  Hunger.  Irritability.  Anxiety.  Restless sleep.  Altered speech or coordination.  Confusion.  Treat hypoglycemia promptly. If you are alert and able to safely swallow, follow the 15:15 rule:  Take 15 20 grams of rapid-acting glucose or carbohydrate. Rapid-acting options include glucose gel, glucose tablets, or 4 ounces (120 mL) of fruit juice, regular soda, or low fat milk.  Check your blood glucose level 15 minutes after taking the glucose.  Take 15 20 grams more of glucose if the repeat blood glucose level is still 70 mg/dL or below.  Eat a meal or snack within 1 hour once blood glucose levels return to normal.    Be alert to polyuria and polydipsia which are early signs of hyperglycemia. An early awareness of hyperglycemia allows for prompt treatment. Treat hyperglycemia as directed by your caregiver.  Engage in at least 150 minutes of moderate-intensity physical activity a week, spread over at least 3 days of the week or as directed by your caregiver. In addition, you should engage in resistance exercise at least 2 times a week or as directed by your caregiver.  Adjust your medicine and food intake as needed if you start a new exercise or sport.  Follow your sick day plan at any time you  are unable to eat or drink as usual.  Avoid tobacco use.  Limit alcohol intake to no more than 1 drink per day for nonpregnant women and 2 drinks per day for men. You should drink alcohol only when you are also eating food. Talk with your caregiver whether alcohol is safe for you. Tell your caregiver if you drink alcohol several times a week.  Follow up with your caregiver regularly.  Schedule an eye exam soon after the diagnosis of type 2 diabetes and then annually.  Perform daily skin and foot care. Examine your skin and feet daily for cuts, bruises, redness, nail problems, bleeding, blisters, or sores. A foot exam by a caregiver should be done annually.  Brush your teeth and gums at least twice a day and floss at least once a day. Follow up with your dentist regularly.  Share your diabetes management plan with your workplace or school.  Stay up-to-date with immunizations.  Learn to manage stress.  Obtain ongoing diabetes education and support as needed.  Participate in, or seek rehabilitation as needed to maintain or improve independence and quality of life. Request a physical or occupational therapy referral if you are having foot or hand numbness or difficulties with grooming,   dressing, eating, or physical activity. SEEK MEDICAL CARE IF:   You are unable to eat food or drink fluids for more than 6 hours.  You have nausea and vomiting for more than 6 hours.  Your blood glucose level is over 240 mg/dL.  There is a change in mental status.  You develop an additional serious illness.  You have diarrhea for more than 6 hours.  You have been sick or have had a fever for a couple of days and are not getting better.  You have pain during any physical activity.  SEEK IMMEDIATE MEDICAL CARE IF:  You have difficulty breathing.  You have moderate to large ketone levels. MAKE SURE YOU:  Understand these instructions.  Will watch your condition.  Will get help right away if  you are not doing well or get worse. Document Released: 08/05/2005 Document Revised: 04/29/2012 Document Reviewed: 03/03/2012 [employer] Patient Information 2014 [employer].  

## 2013-04-21 NOTE — Assessment & Plan Note (Signed)
I have asked him to have arterial flow studies to see how severe this is

## 2013-04-21 NOTE — Assessment & Plan Note (Signed)
He has improved his lifestyle modifications I will recheck his lipids today

## 2013-04-21 NOTE — Assessment & Plan Note (Signed)
His BP is well controlled 

## 2013-04-21 NOTE — Progress Notes (Signed)
Subjective:    Patient ID: [name] Foley, male    DOB: Dec 26, 1975, 37 y.o.   MRN: [id number]  Diabetes He presents for his follow-up diabetic visit. He has type 2 diabetes mellitus. His disease course has been fluctuating. There are no hypoglycemic associated symptoms. Associated symptoms include foot paresthesias and polyuria. Pertinent negatives for diabetes include no blurred vision, no chest pain, no fatigue, no foot ulcerations, no polydipsia, no polyphagia, no visual change, no weakness and no weight loss. There are no hypoglycemic complications. Diabetic complications include heart disease, peripheral neuropathy and PVD. Current diabetic treatment includes oral agent (monotherapy), intensive insulin program and insulin injections. He is compliant with treatment most of the time. His weight is stable. He is following a generally healthy diet. Meal planning includes avoidance of concentrated sweets. He has not had a previous visit with a dietician. He never participates in exercise. His breakfast blood glucose range is generally 110-130 mg/dl. His lunch blood glucose range is generally 140-180 mg/dl. His dinner blood glucose range is generally 180-200 mg/dl. His highest blood glucose is 180-200 mg/dl. His overall blood glucose range is 180-200 mg/dl. An ACE inhibitor/angiotensin II receptor blocker is being taken. He does not see a podiatrist.Eye exam is current.      Review of Systems  Constitutional: Negative.  Negative for fever, chills, weight loss, diaphoresis, activity change, appetite change, fatigue and unexpected weight change.  HENT: Negative.   Eyes: Negative.  Negative for blurred vision.  Respiratory: Negative.  Negative for cough, chest tightness, shortness of breath, wheezing and stridor.   Cardiovascular: Negative.  Negative for chest pain, palpitations and leg swelling.  Gastrointestinal: Positive for constipation. Negative for nausea, vomiting, abdominal pain and diarrhea.   Endocrine: Positive for polyuria. Negative for polydipsia and polyphagia.  Musculoskeletal: Negative.   Skin: Negative.   Allergic/Immunologic: Negative.   Neurological: Negative.  Negative for weakness.  Hematological: Negative.  Negative for adenopathy. Does not bruise/bleed easily.  Psychiatric/Behavioral: Negative.        Objective:   Physical Exam  Vitals reviewed. Constitutional: He is oriented to person, place, and time. He appears well-developed and well-nourished. No distress.  HENT:  Head: Normocephalic and atraumatic.  Mouth/Throat: Oropharynx is clear and moist. No oropharyngeal exudate.  Eyes: Conjunctivae are normal. Right eye exhibits no discharge. Left eye exhibits no discharge. No scleral icterus.  Neck: Normal range of motion. Neck supple. No JVD present. No tracheal deviation present. No thyromegaly present.  Cardiovascular: Normal rate, regular rhythm, normal heart sounds and intact distal pulses.  Exam reveals no gallop and no friction rub.   No murmur heard. Pulmonary/Chest: Effort normal and breath sounds normal. No stridor. No respiratory distress. He has no wheezes. He has no rales. He exhibits no tenderness.  Abdominal: Soft. Bowel sounds are normal. He exhibits no distension and no mass. There is no tenderness. There is no rebound and no guarding.  Musculoskeletal: Normal range of motion. He exhibits edema (1+ pitting edema in BLE). He exhibits no tenderness.  Lymphadenopathy:    He has no cervical adenopathy.  Neurological: He is oriented to person, place, and time.  Skin: Skin is warm and dry. No rash noted. He is not diaphoretic. No erythema. No pallor.     Lab Results  Component Value Date   WBC 8.6 11/19/2012   HGB 15.7 11/19/2012   HCT 46.0 11/19/2012   PLT 259.0 11/19/2012   GLUCOSE 592* 01/18/2013   CHOL 219* 01/18/2013   TRIG 1181.0 Lipemic* 01/18/2013  HDL 33.90* 01/18/2013   LDLDIRECT 41.4 01/18/2013   LDLCALC  Value: 165        Total  Cholesterol/HDL:CHD Risk Coronary Heart Disease Risk Table                     Men   Women  1/2 Average Risk   3.4   3.3* 10/24/2007   ALT 29 11/19/2012   AST 23 11/19/2012   NA 128* 01/18/2013   K 4.2 01/18/2013   CL 92* 01/18/2013   CREATININE 0.9 01/18/2013   BUN 11 01/18/2013   CO2 25 01/18/2013   TSH 0.63 11/19/2012   INR 1.0 RATIO 11/12/2007   HGBA1C 15.4 Repeated and verified X2.* 11/19/2012   MICROALBUR 2.9* 11/19/2012       Assessment & Plan:

## 2013-04-21 NOTE — Assessment & Plan Note (Signed)
He is working on his lifestyle modifications 

## 2013-04-21 NOTE — Assessment & Plan Note (Signed)
I think an SGLT-2 inhibitor would be a good choice to help lower his blood suagrs, he will continue the levemir and [place] Today I will recheck his A1C and will monitor his renal function

## 2013-04-22 ENCOUNTER — Encounter: Payer: Self-pay | Admitting: Internal Medicine

## 2013-04-22 DIAGNOSIS — F1220 Cannabis dependence, uncomplicated: Secondary | ICD-10-CM | POA: Insufficient documentation

## 2013-04-22 LAB — DRUGS OF ABUSE SCREEN W/O ALC, ROUTINE URINE
Benzodiazepines.: NEGATIVE
Creatinine,U: 218.3 mg/dL
Marijuana Metabolite: POSITIVE — AB
Phencyclidine (PCP): NEGATIVE
Propoxyphene: NEGATIVE

## 2013-04-29 ENCOUNTER — Encounter (INDEPENDENT_AMBULATORY_CARE_PROVIDER_SITE_OTHER): Payer: Medicare Other

## 2013-04-29 DIAGNOSIS — I70219 Atherosclerosis of native arteries of extremities with intermittent claudication, unspecified extremity: Secondary | ICD-10-CM

## 2013-04-29 DIAGNOSIS — I739 Peripheral vascular disease, unspecified: Secondary | ICD-10-CM

## 2013-04-29 DIAGNOSIS — E1159 Type 2 diabetes mellitus with other circulatory complications: Secondary | ICD-10-CM

## 2013-05-03 ENCOUNTER — Encounter: Payer: Self-pay | Admitting: Endocrinology

## 2013-05-03 ENCOUNTER — Ambulatory Visit (INDEPENDENT_AMBULATORY_CARE_PROVIDER_SITE_OTHER): Payer: Medicare Other | Admitting: Endocrinology

## 2013-05-03 VITALS — BP 128/78 | HR 80 | Wt 268.0 lb

## 2013-05-03 DIAGNOSIS — E1149 Type 2 diabetes mellitus with other diabetic neurological complication: Secondary | ICD-10-CM

## 2013-05-03 MED ORDER — INSULIN DETEMIR 100 UNIT/ML FLEXPEN: 200.0000 [IU] | PEN_INJECTOR | SUBCUTANEOUS | Status: DC

## 2013-05-03 NOTE — Patient Instructions (Addendum)
check your blood sugar twice a day.  vary the time of day when you check, between before the 3 meals, and at bedtime.  also check if you have symptoms of your blood sugar being too high or too low.  please keep a record of the readings and bring it to your next appointment here.  please call us sooner if your blood sugar goes below 70, or if you have a lot of readings over 200. Please continue the same insulin for now. Please come back for a follow-up appointment in 2 months.

## 2013-05-03 NOTE — Progress Notes (Signed)
Subjective:    Patient ID: [name] Foley, male    DOB: 1976-06-19, 37 y.o.   MRN: [id number]  HPI pt returns for f/u of type 2 DM (dx'ed 2000; he has severe neuropathy of the lower extremities, but no associated complications; he has requested a simple qd insulin regimen).  pt states he feels well in general, except for fatigue.  no cbg record, but states cbg's vary from 87-200.  There is no trend throughout the day, except it is higher after eating than before.   Past Medical History  Diagnosis Date  . CHF (congestive heart failure)     Secondary to ischemic cardiomyopathy  . CAD (coronary artery disease)     VERY Severe  . Morbid obesity   . Type II or unspecified type diabetes mellitus without mention of complication, not stated as uncontrolled   . Hyperlipidemia   . GERD (gastroesophageal reflux disease)     Past Surgical History  Procedure Laterality Date  . Coronary angioplasty with stent placement      History   Social History  . Marital Status: Married    Spouse Name: N/A    Number of Children: N/A  . Years of Education: N/A   Occupational History  . Not on file.   Social History Main Topics  . Smoking status: Former Smoker -- 1.50 packs/day for 20 years  . Smokeless tobacco: Never Used  . Alcohol Use: No  . Drug Use: Yes    Special: Marijuana  . Sexual Activity: Yes    Birth Control/ Protection: Condom   Other Topics Concern  . Not on file   Social History Narrative   Regular Exercise -  NO    Current Outpatient Prescriptions on File Prior to Visit  Medication Sig Dispense Refill  . aspirin 325 MG tablet Take 325 mg by mouth daily.        Marland Kitchen atorvastatin (LIPITOR) 80 MG tablet Take 1 tablet (80 mg total) by mouth daily.  30 tablet  6  . bisoprolol (ZEBETA) 10 MG tablet Take 5 mg by mouth daily.      . Canagliflozin (INVOKANA) 300 MG TABS Take 1 tablet (300 mg total) by mouth daily.  90 tablet  3  . clopidogrel (PLAVIX) 75 MG tablet Take 1 tablet (75  mg total) by mouth daily.  30 tablet  6  . digoxin (LANOXIN) 0.25 MG tablet TAKE ONE TABLET BY MOUTH EVERY DAY  30 tablet  6  . fish oil-omega-3 fatty acids 1000 MG capsule Take 2 g by mouth daily.        . furosemide (LASIX) 40 MG tablet Take 1 tablet (40 mg total) by mouth 2 (two) times daily.  60 tablet  6  . glucose blood test strip 1 each 2 (two) times daily. Use as instructed       . Icosapent Ethyl (VASCEPA) 1 G CAPS Take 2 capsules by mouth 2 (two) times daily.  120 capsule  11  . lisinopril (PRINIVIL,ZESTRIL) 10 MG tablet Take 1 tablet (10 mg total) by mouth 2 (two) times daily.  60 tablet  6  . Multiple Vitamins-Minerals (MENS MULTI VITAMIN & MINERAL PO) Take by mouth. 1 tablet daily.       . pantoprazole (PROTONIX) 40 MG tablet TAKE ONE TABLET BY MOUTH EVERY DAY  30 tablet  3  . potassium chloride SA (K-DUR,KLOR-CON) 20 MEQ tablet Take 1 tablet (20 mEq total) by mouth daily.  30 tablet  6  .  pregabalin (LYRICA) 50 MG capsule Take 1 capsule (50 mg total) by mouth 3 (three) times daily as needed.  84 capsule  0  . spironolactone (ALDACTONE) 25 MG tablet Take 1 tablet (25 mg total) by mouth daily.  30 tablet  6  . colesevelam (WELCHOL) 625 MG tablet Take 3 tablets (1,875 mg total) by mouth 2 (two) times daily with a meal.  180 tablet  11   No current facility-administered medications on file prior to visit.    Allergies  Allergen Reactions  . Metformin     REACTION: diarrhea; severe nausea and abdominal cramping    Family History  Problem Relation Age of Onset  . Hyperlipidemia Mother   . Hypertension Father   . Diabetes Father   . Heart disease Father   . Cancer Neg Hx   . COPD Neg Hx   . Drug abuse Neg Hx   . Hearing loss Neg Hx   . Kidney disease Neg Hx   . Stroke Neg Hx   . Alcohol abuse Neg Hx     BP 128/78  Pulse 80  Wt 268 lb (121.564 kg)  BMI 35.37 kg/m2  SpO2 98%  Review of Systems denies hypoglycemia.  He has lost a few lbs    Objective:   Physical  Exam VITAL SIGNS:  See vs page GENERAL: no distress  Lab Results  Component Value Date   HGBA1C 9.7* 04/21/2013      Assessment & Plan:  DM: control is improved.  Next step is to see what next a1c is. CAD: in this context, he should avoid hypoglycemia. Neuropathy: this limits exercise rx of DM

## 2013-06-02 ENCOUNTER — Telehealth: Payer: Self-pay | Admitting: Internal Medicine

## 2013-06-02 MED ORDER — PREGABALIN 50 MG PO CAPS: 50.0000 mg | ORAL_CAPSULE | Freq: Three times a day (TID) | ORAL | Status: DC | PRN

## 2013-06-02 NOTE — Telephone Encounter (Signed)
Returned call to pt who request samples of Lyrica 50 mg. Pt advised samples available and placed upfront

## 2013-06-02 NOTE — Telephone Encounter (Signed)
Patient would like call back about some medication, stated he didn't want to spk with a nurse just you  Please contact him back at [phone]

## 2013-06-02 NOTE — Addendum Note (Signed)
Addended by: Rock Nephew T on: 06/02/2013 01:41 PM   Modules accepted: Orders

## 2013-06-04 ENCOUNTER — Other Ambulatory Visit (HOSPITAL_COMMUNITY): Payer: Self-pay

## 2013-06-04 DIAGNOSIS — K219 Gastro-esophageal reflux disease without esophagitis: Secondary | ICD-10-CM

## 2013-06-04 MED ORDER — PANTOPRAZOLE SODIUM 40 MG PO TBEC: DELAYED_RELEASE_TABLET | ORAL | Status: DC

## 2013-06-04 NOTE — Telephone Encounter (Signed)
Refill for Pantoprazole 40 mg sent to pharmacy.

## 2013-07-01 NOTE — Telephone Encounter (Signed)
Phone note completed ° °

## 2013-07-02 ENCOUNTER — Other Ambulatory Visit (HOSPITAL_COMMUNITY): Payer: Self-pay | Admitting: Internal Medicine

## 2013-07-05 ENCOUNTER — Encounter: Payer: Self-pay | Admitting: Endocrinology

## 2013-07-05 ENCOUNTER — Ambulatory Visit (INDEPENDENT_AMBULATORY_CARE_PROVIDER_SITE_OTHER): Payer: BC Managed Care – PPO | Admitting: Endocrinology

## 2013-07-05 VITALS — BP 118/70 | HR 56 | Temp 98.3°F | Resp 16 | Ht 73.0 in | Wt 266.8 lb

## 2013-07-05 DIAGNOSIS — E1149 Type 2 diabetes mellitus with other diabetic neurological complication: Secondary | ICD-10-CM

## 2013-07-05 LAB — HEMOGLOBIN A1C: Hgb A1c MFr Bld: 9 % — ABNORMAL HIGH (ref 4.6–6.5)

## 2013-07-05 NOTE — Patient Instructions (Signed)
check your blood sugar twice a day.  vary the time of day when you check, between before the 3 meals, and at bedtime.  also check if you have symptoms of your blood sugar being too high or too low.  please keep a record of the readings and bring it to your next appointment here.  please call us sooner if your blood sugar goes below 70, or if you have a lot of readings over 200.  blood tests are being requested for you today.  We'll contact you with results.   Please come back for a follow-up appointment in 3 months.   

## 2013-07-05 NOTE — Progress Notes (Signed)
Subjective:    Patient ID: [name] Foley, male    DOB: [birth year], 37 y.o.   MRN: [id number]  HPI pt returns for f/u of type 2 DM (dx'ed 2000, on a routine blood test; he has severe neuropathy of the lower extremities, but no associated complications; he has been on insulin since 2013, but has requested to take invokana also; he has requested a simple qd insulin regimen).  pt states he feels well in general.  no cbg record, but states cbg's vary from 80-200.  There is no trend throughout the day, except it is higher after eating than before.   Past Medical History  Diagnosis Date  . CHF (congestive heart failure)     Secondary to ischemic cardiomyopathy  . CAD (coronary artery disease)     VERY Severe  . Morbid obesity   . Type II or unspecified type diabetes mellitus without mention of complication, not stated as uncontrolled   . Hyperlipidemia   . GERD (gastroesophageal reflux disease)     Past Surgical History  Procedure Laterality Date  . Coronary angioplasty with stent placement      History   Social History  . Marital Status: Married    Spouse Name: N/A    Number of Children: N/A  . Years of Education: N/A   Occupational History  . Not on file.   Social History Main Topics  . Smoking status: Former Smoker -- 1.50 packs/day for 20 years  . Smokeless tobacco: Never Used  . Alcohol Use: No  . Drug Use: Yes    Special: Marijuana  . Sexual Activity: Yes    Birth Control/ Protection: Condom   Other Topics Concern  . Not on file   Social History Narrative   Regular Exercise -  NO    Current Outpatient Prescriptions on File Prior to Visit  Medication Sig Dispense Refill  . aspirin 325 MG tablet Take 325 mg by mouth daily.        Marland Kitchen atorvastatin (LIPITOR) 80 MG tablet TAKE 1 TABLET BY MOUTH ONCE DAILY  30 tablet  3  . bisoprolol (ZEBETA) 10 MG tablet Take 5 mg by mouth daily.      . Canagliflozin (INVOKANA) 300 MG TABS Take 1 tablet (300 mg total) by mouth daily.   90 tablet  3  . clopidogrel (PLAVIX) 75 MG tablet TAKE 1 TABLET BY MOUTH ONCE DAILY  30 tablet  3  . digoxin (LANOXIN) 0.25 MG tablet TAKE ONE TABLET BY MOUTH EVERY DAY  30 tablet  6  . fish oil-omega-3 fatty acids 1000 MG capsule Take 2 g by mouth daily.        . furosemide (LASIX) 40 MG tablet TAKE 1 TABLET BY MOUTH TWICE DAILY  60 tablet  3  . glucose blood test strip 1 each 2 (two) times daily. Use as instructed       . Icosapent Ethyl (VASCEPA) 1 G CAPS Take 2 capsules by mouth 2 (two) times daily.  120 capsule  11  . lisinopril (PRINIVIL,ZESTRIL) 10 MG tablet TAKE 1 TABLET BY MOUTH TWICE DAILY  60 tablet  3  . Multiple Vitamins-Minerals (MENS MULTI VITAMIN & MINERAL PO) Take by mouth. 1 tablet daily.       . pantoprazole (PROTONIX) 40 MG tablet TAKE ONE TABLET BY MOUTH EVERY DAY  30 tablet  3  . potassium chloride SA (K-DUR,KLOR-CON) 20 MEQ tablet TAKE 1 TABLET BY MOUTH DAILY  30 tablet  3  .  pregabalin (LYRICA) 50 MG capsule Take 1 capsule (50 mg total) by mouth 3 (three) times daily as needed.  63 capsule  0  . spironolactone (ALDACTONE) 25 MG tablet Take 1 tablet (25 mg total) by mouth daily.  30 tablet  6  . colesevelam (WELCHOL) 625 MG tablet Take 3 tablets (1,875 mg total) by mouth 2 (two) times daily with a meal.  180 tablet  11   No current facility-administered medications on file prior to visit.    Allergies  Allergen Reactions  . Metformin     REACTION: diarrhea; severe nausea and abdominal cramping    Family History  Problem Relation Age of Onset  . Hyperlipidemia Mother   . Hypertension Father   . Diabetes Father   . Heart disease Father   . Cancer Neg Hx   . COPD Neg Hx   . Drug abuse Neg Hx   . Hearing loss Neg Hx   . Kidney disease Neg Hx   . Stroke Neg Hx   . Alcohol abuse Neg Hx     BP 118/70  Pulse 56  Temp(Src) 98.3 F (36.8 C) (Oral)  Resp 16  Ht 6\' 1"  (1.854 m)  Wt 266 lb 12.8 oz (121.02 kg)  BMI 35.21 kg/m2  Review of Systems denies  hypoglycemia and weight change.      Objective:   Physical Exam VITAL SIGNS:  See vs page.   GENERAL: no distress. PSYCH: Alert and oriented x 3.  Does not appear anxious nor depressed.  Lab Results  Component Value Date   HGBA1C 9.0* 07/05/2013      Assessment & Plan:  DM: This insulin regimen was chosen from multiple options, for its simplicity.  The benefits of glycemic control must be weighed against the risks of hypoglycemia.   CAD: in this context, he should avoid hypoglycemia. Neuropathy: this limits exercise rx of DM.

## 2013-07-06 ENCOUNTER — Encounter: Payer: Self-pay | Admitting: *Deleted

## 2013-07-06 ENCOUNTER — Telehealth: Payer: Self-pay | Admitting: *Deleted

## 2013-07-06 MED ORDER — INSULIN DETEMIR 100 UNIT/ML FLEXPEN: 230.0000 [IU] | PEN_INJECTOR | SUBCUTANEOUS | Status: AC

## 2013-07-06 NOTE — Telephone Encounter (Signed)
Notified pt to increase insulin to 230 units each morning, per Dr. Everardo All.  Pt requested that pharmacy be notified so he could have enough insulin.

## 2013-08-01 ENCOUNTER — Other Ambulatory Visit (HOSPITAL_COMMUNITY): Payer: Self-pay | Admitting: Internal Medicine

## 2013-08-03 ENCOUNTER — Other Ambulatory Visit: Payer: Self-pay

## 2013-08-03 MED ORDER — DIGOXIN 250 MCG PO TABS: ORAL_TABLET | ORAL | Status: DC

## 2013-08-24 ENCOUNTER — Telehealth (HOSPITAL_COMMUNITY): Payer: Self-pay | Admitting: Anesthesiology

## 2013-08-24 ENCOUNTER — Ambulatory Visit (HOSPITAL_COMMUNITY)
Admission: RE | Admit: 2013-08-24 | Discharge: 2013-08-24 | Disposition: A | Discharge status: Home / Self Care | Payer: BC Managed Care – PPO | Source: Ambulatory Visit | Attending: Internal Medicine | Admitting: Internal Medicine

## 2013-08-24 VITALS — BP 124/58 | HR 60 | Resp 16 | Wt 272.8 lb

## 2013-08-24 DIAGNOSIS — E119 Type 2 diabetes mellitus without complications: Secondary | ICD-10-CM | POA: Insufficient documentation

## 2013-08-24 DIAGNOSIS — I5022 Chronic systolic (congestive) heart failure: Secondary | ICD-10-CM | POA: Diagnosis not present

## 2013-08-24 DIAGNOSIS — I2510 Atherosclerotic heart disease of native coronary artery without angina pectoris: Secondary | ICD-10-CM | POA: Insufficient documentation

## 2013-08-24 DIAGNOSIS — E785 Hyperlipidemia, unspecified: Secondary | ICD-10-CM

## 2013-08-24 LAB — BASIC METABOLIC PANEL
BUN: 25 mg/dL — AB (ref 6–23)
CHLORIDE: 98 meq/L (ref 96–112)
CO2: 27 mEq/L (ref 19–32)
CREATININE: 0.91 mg/dL (ref 0.50–1.35)
Calcium: 9.5 mg/dL (ref 8.4–10.5)
GFR calc non Af Amer: >90 mL/min (ref >90)
Glucose, Bld: 204 mg/dL — ABNORMAL HIGH (ref 70–99)
Potassium: 4.6 mEq/L (ref 3.7–5.3)
Sodium: 138 mEq/L (ref 137–147)

## 2013-08-24 LAB — DIGOXIN LEVEL: Digoxin Level: 1.6 ng/mL (ref 0.8–2.0)

## 2013-08-24 MED ORDER — DIGOXIN 125 MCG PO TABS: ORAL_TABLET | ORAL | Status: DC

## 2013-08-24 NOTE — Patient Instructions (Signed)
Take an extra 40 mg of lasix for 2 days then go back to 40 mg twice a day.  Will call with labs if abnormal.  Can try taking your spironolactone at night time to help with dizziness.  Do the following things EVERYDAY: 1) Weigh yourself in the morning before breakfast. Write it down and keep it in a log. 2) Take your medicines as prescribed 3) Eat low salt foods-Limit salt (sodium) to 2000 mg per day.  4) Stay as active as you can everyday 5) Limit all fluids for the day to less than 2 liters 6)

## 2013-08-24 NOTE — Progress Notes (Signed)
Patient ID: [name] Foley, male   DOB: 1976-06-23, 38 y.o.   MRN: [id number]  PCP: Dr Yetta BarreJones Endocrinologist: Dr Everardo AllEllison  HPI: [name] Foley is a 38 year old male with a history of morbid obesity, hypertension, diabetes, and congestive heart failure secondary to severe ischemic cardiomyopathy with ejection fraction of 20% previously.  Cardiac catheterization 2009 year showed severe three- vessel coronary artery disease which was not amenable to bypass surgery due to severe distal disease.  He did undergo angioplasty of a portion of his LAD.   11/2009 Echo  EF 40%. 04/2011 Echo EF 20-25% 02/13/12 ECHO EF 30-35% 02/09/13 ECHO EF ~ 40-45% Dr Gala RomneyBensimhon reviewed personally   Follow up: Last visit volume status slightly elevated and took extra 40 mg lasix. Overall doing pretty good. Complaints of sinus infection currently. Blood sugars much improved 90-140 in the am.  Denies SOB, Orthopnea, or CP. Minimal edema. Weight at home 265 lbs. Not working, disabled. Has not taken any extra lasix. pounds. Able to walk around the grocery store with no issues. + peripheral neuropathy pain. Compliant with medications. SBP at home 80-90. Has dizziness occasionally. Did not tolerate increase in bisoprol  ROS: All systems negative except as listed in HPI, PMH and Problem List.  Past Medical History  Diagnosis Date  . CHF (congestive heart failure)     Secondary to ischemic cardiomyopathy  . CAD (coronary artery disease)     VERY Severe  . Morbid obesity   . Type II or unspecified type diabetes mellitus without mention of complication, not stated as uncontrolled   . Hyperlipidemia   . GERD (gastroesophageal reflux disease)     Current Outpatient Prescriptions  Medication Sig Dispense Refill  . aspirin 325 MG tablet Take 325 mg by mouth daily.        Marland Kitchen. atorvastatin (LIPITOR) 80 MG tablet TAKE 1 TABLET BY MOUTH ONCE DAILY  30 tablet  3  . bisoprolol (ZEBETA) 10 MG tablet Take 5 mg by mouth daily.      . clopidogrel  (PLAVIX) 75 MG tablet TAKE 1 TABLET BY MOUTH ONCE DAILY  30 tablet  3  . digoxin (LANOXIN) 0.25 MG tablet TAKE ONE TABLET BY MOUTH EVERY DAY  30 tablet  6  . fish oil-omega-3 fatty acids 1000 MG capsule Take 2 g by mouth daily.        . furosemide (LASIX) 40 MG tablet TAKE 1 TABLET BY MOUTH TWICE DAILY  60 tablet  3  . glucose blood test strip 1 each 2 (two) times daily. Use as instructed       . Insulin Detemir 100 UNIT/ML SOPN Inject 230 Units into the skin every morning.  75 mL  11  . lisinopril (PRINIVIL,ZESTRIL) 10 MG tablet TAKE 1 TABLET BY MOUTH TWICE DAILY  60 tablet  3  . Multiple Vitamins-Minerals (MENS MULTI VITAMIN & MINERAL PO) Take by mouth. 1 tablet daily.       . pantoprazole (PROTONIX) 40 MG tablet TAKE ONE TABLET BY MOUTH EVERY DAY  30 tablet  3  . potassium chloride SA (K-DUR,KLOR-CON) 20 MEQ tablet TAKE 1 TABLET BY MOUTH DAILY  30 tablet  3  . pregabalin (LYRICA) 50 MG capsule Take 75 mg by mouth 2 (two) times daily.      Marland Kitchen. spironolactone (ALDACTONE) 25 MG tablet TAKE 1 TABLET BY MOUTH DAILY  30 tablet  0   No current facility-administered medications for this encounter.    Filed Vitals:   08/24/13 1154  BP:  124/58  Pulse: 60  Resp: 16  Weight: 272 lb 12 oz (123.719 kg)  SpO2: 98%   PHYSICAL EXAM General:  well appearing. no resp difficulty HEENT: normal Neck: supple.  JVP 8. Carotids 2+ bilat; no bruits. No lymphadenopathy or thryomegaly appreciated. Cor: PMI nondisplaced. Regular rate & rhythm. No rubs, gallops, murmur. Lungs: clear Abdomen: obese, soft, nontender, nondistended. No hepatosplenomegaly. No bruits or masses. Good bowel sounds. Extremities: no cyanosis, clubbing, rash,  Trace RLE and LLE edema Neuro: alert & orientedx3, cranial nerves grossly intact. moves all 4 extremities w/o difficulty. affect normal   ASSESSMENT & PLAN: 1. Chronic Systolic Heart Failure ECHO 01/2013 EF 40-45% - NYHA II symptoms and volume status slightly elevated. Will  increase lasix for two days to 80 mg q am and 40 mg q pm, then back to 40 mg BID. - Will not titrate any medications with soft BP. Discussed with dizziness at home that he can swtich his spironolactone to PM. - continue BB, ACE-I and digoxin at current doses. Check digoxin level and BMET today. - Reinforced the need and importance of daily weights, a low sodium diet, and fluid restriction (less than 2 L a day). Instructed to call the HF clinic if weight increases more than 3 lbs overnight or 5 lbs in a week. 2. CAD- Cardiac catheterization 2009 showed severe three- vessel coronary artery disease which was not amenable to bypass surgery due to severe distal disease. Angioplasty of a portion of his LAD.  - no s/s of ischemia - continue ASA and statin. 3. DM -  Followed by Dr Everardo All. Hgb A1c improving and praised patient for working on this and encouraged to try and continue to watch portion control and exercise. 4. Hyperlipidemia/Hypertriglyceridemia Followed by Dr. Everardo All. Much improved with current therapy.   F/U 6 months  Ulla Potash B NP-C 12:02 PM  Patient seen and examined with Ulla Potash, NP. We discussed all aspects of the encounter. I agree with the assessment and plan as stated above.   Doing well. NYHA II. Volume status mildly elevated. Will have him take extra lasix for 2 days. Reinforced need for daily weights and reviewed use of sliding scale diuretics. Unable to titrate b-blocker or ACE due to intolerance. CAD stable without ischemia. Lipids much improved. Check labs today.  Truman Hayward 8:35 PM

## 2013-08-24 NOTE — Telephone Encounter (Signed)
Called patient about labs. Digoxin level elevated at 1.6. Have asked the patient to cut back to 1/2 tablet daily. He is aware and changed prescription in the computer and sent to pharmacy.

## 2013-08-25 ENCOUNTER — Encounter: Payer: Self-pay | Admitting: Internal Medicine

## 2013-08-25 ENCOUNTER — Ambulatory Visit (INDEPENDENT_AMBULATORY_CARE_PROVIDER_SITE_OTHER): Payer: BC Managed Care – PPO | Admitting: Internal Medicine

## 2013-08-25 VITALS — BP 110/68 | HR 60 | Temp 97.8°F | Resp 16 | Ht 73.0 in | Wt 272.0 lb

## 2013-08-25 DIAGNOSIS — B9689 Other specified bacterial agents as the cause of diseases classified elsewhere: Secondary | ICD-10-CM | POA: Insufficient documentation

## 2013-08-25 DIAGNOSIS — J019 Acute sinusitis, unspecified: Secondary | ICD-10-CM

## 2013-08-25 MED ORDER — CEFUROXIME AXETIL 500 MG PO TABS: 500.0000 mg | ORAL_TABLET | Freq: Two times a day (BID) | ORAL | Status: DC

## 2013-08-25 NOTE — Patient Instructions (Signed)

## 2013-08-25 NOTE — Progress Notes (Signed)
Subjective:    Patient ID: [name] Foley, male    DOB: 1976-08-08, 38 y.o.   MRN: [id number]  Sinusitis This is a recurrent problem. The current episode started more than 1 month ago. The problem has been gradually worsening since onset. There has been no fever. The fever has been present for less than 1 day. His pain is at a severity of 1/10. He is experiencing no pain. Associated symptoms include congestion, sinus pressure and a sore throat. Pertinent negatives include no chills, coughing, diaphoresis, ear pain, headaches, hoarse voice, neck pain, shortness of breath or sneezing. Past treatments include oral decongestants. The treatment provided mild relief.      Review of Systems  Constitutional: Negative.  Negative for fever, chills, diaphoresis, appetite change and fatigue.  HENT: Positive for congestion, sinus pressure and sore throat. Negative for ear discharge, ear pain, hoarse voice, mouth sores, nosebleeds, postnasal drip, rhinorrhea, sneezing, tinnitus, trouble swallowing and voice change.   Eyes: Negative.   Respiratory: Negative.  Negative for cough and shortness of breath.   Cardiovascular: Negative.  Negative for chest pain, palpitations and leg swelling.  Gastrointestinal: Negative.  Negative for nausea, vomiting, abdominal pain, diarrhea, constipation and blood in stool.  Endocrine: Negative.   Genitourinary: Negative.   Musculoskeletal: Negative.  Negative for neck pain.  Skin: Negative.   Allergic/Immunologic: Negative.   Neurological: Negative.  Negative for headaches.  Hematological: Negative.  Negative for adenopathy. Does not bruise/bleed easily.  Psychiatric/Behavioral: Negative.        Objective:   Physical Exam  Vitals reviewed. Constitutional: He is oriented to person, place, and time. He appears well-developed and well-nourished.  Non-toxic appearance. He does not have a sickly appearance. He does not appear ill. No distress.  HENT:  Head:  Normocephalic and atraumatic.  Right Ear: Hearing, tympanic membrane, external ear and ear canal normal.  Left Ear: Hearing, tympanic membrane, external ear and ear canal normal.  Nose: No mucosal edema or rhinorrhea. Right sinus exhibits maxillary sinus tenderness. Right sinus exhibits no frontal sinus tenderness. Left sinus exhibits maxillary sinus tenderness. Left sinus exhibits no frontal sinus tenderness.  Mouth/Throat: Oropharynx is clear and moist and mucous membranes are normal. Mucous membranes are not pale, not dry and not cyanotic. No oral lesions. No trismus in the jaw. No uvula swelling. No oropharyngeal exudate, posterior oropharyngeal edema, posterior oropharyngeal erythema or tonsillar abscesses.  Eyes: Conjunctivae are normal. Right eye exhibits no discharge. Left eye exhibits no discharge. No scleral icterus.  Neck: Normal range of motion. Neck supple. No JVD present. No tracheal deviation present. No thyromegaly present.  Cardiovascular: Normal rate, regular rhythm, normal heart sounds and intact distal pulses.  Exam reveals no gallop and no friction rub.   No murmur heard. Pulmonary/Chest: Effort normal and breath sounds normal. No stridor. No respiratory distress. He has no wheezes. He has no rales. He exhibits no tenderness.  Abdominal: Soft. Bowel sounds are normal. He exhibits no distension and no mass. There is no tenderness. There is no rebound and no guarding.  Musculoskeletal: Normal range of motion. He exhibits no edema and no tenderness.  Lymphadenopathy:    He has no cervical adenopathy.  Neurological: He is oriented to person, place, and time.  Skin: Skin is warm and dry. No rash noted. He is not diaphoretic. No erythema. No pallor.  Psychiatric: He has a normal mood and affect. His behavior is normal. Judgment and thought content normal.     Lab Results  Component  Value Date   WBC 8.6 11/19/2012   HGB 15.7 11/19/2012   HCT 46.0 11/19/2012   PLT 259.0 11/19/2012    GLUCOSE 204* 08/24/2013   CHOL 125 04/21/2013   TRIG 164.0* 04/21/2013   HDL 27.10* 04/21/2013   LDLDIRECT 41.4 01/18/2013   LDLCALC 65 04/21/2013   ALT 29 11/19/2012   AST 23 11/19/2012   NA 138 08/24/2013   K 4.6 08/24/2013   CL 98 08/24/2013   CREATININE 0.91 08/24/2013   BUN 25* 08/24/2013   CO2 27 08/24/2013   TSH 0.63 11/19/2012   INR 1.0 RATIO 11/12/2007   HGBA1C 9.0* 07/05/2013   MICROALBUR 2.9* 11/19/2012       Assessment & Plan:

## 2013-08-25 NOTE — Progress Notes (Signed)
Pre visit review using our clinic review tool, if applicable. No additional management support is needed unless otherwise documented below in the visit note. 

## 2013-08-26 NOTE — Assessment & Plan Note (Signed)
I will treat the infection with ceftin 

## 2013-08-30 ENCOUNTER — Other Ambulatory Visit (HOSPITAL_COMMUNITY): Payer: Self-pay | Admitting: Internal Medicine

## 2013-09-13 ENCOUNTER — Telehealth: Payer: Self-pay | Admitting: Internal Medicine

## 2013-09-13 NOTE — Telephone Encounter (Signed)
09/13/2013  Pt has questions about medications.  Would not disclose anymore information than that.  Please contact pt.

## 2013-09-14 NOTE — Telephone Encounter (Signed)
pT STOPPED BY TO PICK UP SAMPLES OF LYRICA

## 2013-09-23 ENCOUNTER — Ambulatory Visit: Payer: Medicare Other | Admitting: Internal Medicine

## 2013-09-23 DIAGNOSIS — Z0289 Encounter for other administrative examinations: Secondary | ICD-10-CM

## 2013-09-28 ENCOUNTER — Other Ambulatory Visit (HOSPITAL_COMMUNITY): Payer: Self-pay | Admitting: Internal Medicine

## 2013-10-06 ENCOUNTER — Ambulatory Visit: Payer: Medicare Other | Admitting: Endocrinology

## 2013-10-06 DIAGNOSIS — Z0289 Encounter for other administrative examinations: Secondary | ICD-10-CM

## 2013-10-21 ENCOUNTER — Ambulatory Visit (INDEPENDENT_AMBULATORY_CARE_PROVIDER_SITE_OTHER)
Admission: RE | Admit: 2013-10-21 | Discharge: 2013-10-21 | Disposition: A | Discharge status: Home / Self Care | Payer: BC Managed Care – PPO | Source: Ambulatory Visit | Attending: Internal Medicine | Admitting: Internal Medicine

## 2013-10-21 ENCOUNTER — Encounter: Payer: Self-pay | Admitting: Internal Medicine

## 2013-10-21 ENCOUNTER — Ambulatory Visit (INDEPENDENT_AMBULATORY_CARE_PROVIDER_SITE_OTHER): Payer: BC Managed Care – PPO | Admitting: Internal Medicine

## 2013-10-21 VITALS — BP 106/64 | HR 76 | Temp 98.7°F | Resp 16 | Ht 73.0 in | Wt 284.0 lb

## 2013-10-21 DIAGNOSIS — R05 Cough: Secondary | ICD-10-CM

## 2013-10-21 DIAGNOSIS — R059 Cough, unspecified: Secondary | ICD-10-CM

## 2013-10-21 DIAGNOSIS — J189 Pneumonia, unspecified organism: Secondary | ICD-10-CM | POA: Insufficient documentation

## 2013-10-21 DIAGNOSIS — B9689 Other specified bacterial agents as the cause of diseases classified elsewhere: Secondary | ICD-10-CM

## 2013-10-21 DIAGNOSIS — J019 Acute sinusitis, unspecified: Secondary | ICD-10-CM

## 2013-10-21 MED ORDER — PROMETHAZINE-DM 6.25-15 MG/5ML PO SYRP: 5.0000 mL | ORAL_SOLUTION | Freq: Four times a day (QID) | ORAL | Status: DC | PRN

## 2013-10-21 MED ORDER — CEFUROXIME AXETIL 500 MG PO TABS: 500.0000 mg | ORAL_TABLET | Freq: Two times a day (BID) | ORAL | Status: DC

## 2013-10-21 NOTE — Progress Notes (Signed)
Subjective:    Patient ID: [name] Foley, male    DOB: 1976-04-13, 38 y.o.   MRN: [id number]  Cough This is a new problem. The current episode started in the past 7 days. The problem has been gradually improving. The problem occurs every few hours. The cough is productive of purulent sputum. Associated symptoms include a fever, a sore throat, shortness of breath and sweats. Pertinent negatives include no chest pain, chills, ear congestion, ear pain, headaches, heartburn, hemoptysis, myalgias, nasal congestion, postnasal drip, rash, rhinorrhea, weight loss or wheezing. Nothing aggravates the symptoms. He has tried OTC cough suppressant for the symptoms. The treatment provided moderate relief. His past medical history is significant for bronchitis and COPD. There is no history of asthma, bronchiectasis, emphysema, environmental allergies or pneumonia.      Review of Systems  Constitutional: Positive for fever. Negative for chills, weight loss, diaphoresis, fatigue and unexpected weight change.  HENT: Positive for sore throat. Negative for ear pain, postnasal drip, rhinorrhea and trouble swallowing.   Eyes: Negative.   Respiratory: Positive for cough and shortness of breath. Negative for apnea, hemoptysis, choking, chest tightness, wheezing and stridor.   Cardiovascular: Negative.  Negative for chest pain, palpitations and leg swelling.  Gastrointestinal: Negative.  Negative for heartburn, nausea, vomiting, abdominal pain, diarrhea, constipation and blood in stool.  Endocrine: Negative.   Genitourinary: Negative.   Musculoskeletal: Negative.  Negative for myalgias.  Skin: Negative.  Negative for rash.  Allergic/Immunologic: Negative.  Negative for environmental allergies.  Neurological: Negative.  Negative for headaches.  Hematological: Negative.  Negative for adenopathy. Does not bruise/bleed easily.  Psychiatric/Behavioral: Negative.        Objective:   Physical Exam  Vitals  reviewed. Constitutional: He is oriented to person, place, and time. He appears well-developed and well-nourished.  Non-toxic appearance. He does not have a sickly appearance. He does not appear ill. No distress.  HENT:  Head: Normocephalic and atraumatic.  Mouth/Throat: Oropharynx is clear and moist. No oropharyngeal exudate.  Eyes: Conjunctivae are normal. Right eye exhibits no discharge. Left eye exhibits no discharge. No scleral icterus.  Neck: Normal range of motion. Neck supple. No JVD present. No tracheal deviation present. No thyromegaly present.  Cardiovascular: Normal rate, regular rhythm, normal heart sounds and intact distal pulses.  Exam reveals no gallop and no friction rub.   No murmur heard. Pulmonary/Chest: Effort normal and breath sounds normal. No stridor. No respiratory distress. He has no wheezes. He has no rales. He exhibits no tenderness.  Abdominal: Soft. Bowel sounds are normal. He exhibits no distension. There is no tenderness. There is no rebound and no guarding.  Musculoskeletal: Normal range of motion. He exhibits no edema and no tenderness.  Lymphadenopathy:    He has no cervical adenopathy.  Neurological: He is oriented to person, place, and time.  Skin: Skin is warm and dry. No rash noted. He is not diaphoretic. No erythema. No pallor.  Psychiatric: He has a normal mood and affect. His behavior is normal. Judgment and thought content normal.     Lab Results  Component Value Date   WBC 8.6 11/19/2012   HGB 15.7 11/19/2012   HCT 46.0 11/19/2012   PLT 259.0 11/19/2012   GLUCOSE 204* 08/24/2013   CHOL 125 04/21/2013   TRIG 164.0* 04/21/2013   HDL 27.10* 04/21/2013   LDLDIRECT 41.4 01/18/2013   LDLCALC 65 04/21/2013   ALT 29 11/19/2012   AST 23 11/19/2012   NA 138 08/24/2013   K  4.6 08/24/2013   CL 98 08/24/2013   CREATININE 0.91 08/24/2013   BUN 25* 08/24/2013   CO2 27 08/24/2013   TSH 0.63 11/19/2012   INR 1.0 RATIO 11/12/2007   HGBA1C 9.0* 07/05/2013   MICROALBUR 2.9* 11/19/2012        Assessment & Plan:

## 2013-10-21 NOTE — Progress Notes (Signed)
Pre visit review using our clinic review tool, if applicable. No additional management support is needed unless otherwise documented below in the visit note. 

## 2013-10-21 NOTE — Assessment & Plan Note (Signed)
CXR is neg for pna, mass, edema 

## 2013-10-21 NOTE — Patient Instructions (Signed)

## 2013-10-21 NOTE — Assessment & Plan Note (Signed)
I will treat with ceftin and will control the cough with phenergan-dm

## 2013-10-24 ENCOUNTER — Other Ambulatory Visit (HOSPITAL_COMMUNITY): Payer: Self-pay | Admitting: Internal Medicine

## 2013-10-26 ENCOUNTER — Telehealth: Payer: Self-pay | Admitting: Internal Medicine

## 2013-10-26 NOTE — Telephone Encounter (Signed)
Pt notified by phone to come in a pick up samples, jk/cma

## 2013-10-26 NOTE — Telephone Encounter (Signed)
Patient states that he has a few "personal" questions about his Lyrica medication. He would not allow me to notate questions. Please advise.

## 2013-11-11 ENCOUNTER — Ambulatory Visit (HOSPITAL_COMMUNITY)
Admission: RE | Admit: 2013-11-11 | Discharge: 2013-11-11 | Disposition: A | Discharge status: Home / Self Care | Payer: BC Managed Care – PPO | Source: Ambulatory Visit | Attending: Internal Medicine | Admitting: Internal Medicine

## 2013-11-11 VITALS — BP 98/62 | HR 77 | Wt 303.0 lb

## 2013-11-11 DIAGNOSIS — I5022 Chronic systolic (congestive) heart failure: Secondary | ICD-10-CM | POA: Diagnosis present

## 2013-11-11 DIAGNOSIS — I2510 Atherosclerotic heart disease of native coronary artery without angina pectoris: Secondary | ICD-10-CM

## 2013-11-11 LAB — BASIC METABOLIC PANEL
BUN: 37 mg/dL — ABNORMAL HIGH (ref 6–23)
CALCIUM: 9.5 mg/dL (ref 8.4–10.5)
CHLORIDE: 104 meq/L (ref 96–112)
CO2: 25 meq/L (ref 19–32)
Creatinine, Ser: 1.28 mg/dL (ref 0.50–1.35)
GFR calc Af Amer: 81 mL/min — ABNORMAL LOW (ref >90)
GFR calc non Af Amer: 70 mL/min — ABNORMAL LOW (ref >90)
Glucose, Bld: 169 mg/dL — ABNORMAL HIGH (ref 70–99)
Potassium: 5 mEq/L (ref 3.7–5.3)
SODIUM: 141 meq/L (ref 137–147)

## 2013-11-11 MED ORDER — METOLAZONE 5 MG PO TABS: 5.0000 mg | ORAL_TABLET | ORAL | Status: AC

## 2013-11-11 MED ORDER — FUROSEMIDE 80 MG PO TABS: 80.0000 mg | ORAL_TABLET | Freq: Two times a day (BID) | ORAL | Status: DC

## 2013-11-11 MED ORDER — POTASSIUM CHLORIDE CRYS ER 20 MEQ PO TBCR: EXTENDED_RELEASE_TABLET | ORAL | Status: DC

## 2013-11-11 NOTE — Progress Notes (Addendum)
Patient ID: [name] Foley, male   DOB: 09-Mar-1976, 38 y.o.   MRN: [id number]  PCP: Dr Yetta Barre Endocrinologist: Dr Everardo All  HPI: [name] Foley is a 38 year old male with a history of morbid obesity, hypertension, diabetes, and congestive heart failure secondary to severe ischemic cardiomyopathy. Most recent EF 40-45% Cardiac catheterization 2009 year showed severe three- vessel coronary artery disease which was not amenable to bypass surgery due to severe distal disease.  He did undergo angioplasty of a portion of his LAD.   11/2009 Echo  EF 40%. 04/2011 Echo EF 20-25% 02/13/12 ECHO EF 30-35% 02/09/13 ECHO EF ~ 40-45% Dr [name] reviewed personally   Follow up: Returns for routine f/u. Has not been feeling well for 2-3 weeks. Has noted marked swelling. Weight up 20+. Very fatigued. +DOE. Gets tired even taking a shower. Chest feels heavy. Increased lasix a few days ago an urine output had picked up on a little. Has been complaint with meds. Diet unchanged. BP running low. Feels dizzy at times.    ROS: All systems negative except as listed in HPI, PMH and Problem List.  Past Medical History  Diagnosis Date  . CHF (congestive heart failure)     Secondary to ischemic cardiomyopathy  . CAD (coronary artery disease)     VERY Severe  . Morbid obesity   . Type II or unspecified type diabetes mellitus without mention of complication, not stated as uncontrolled   . Hyperlipidemia   . GERD (gastroesophageal reflux disease)     Current Outpatient Prescriptions  Medication Sig Dispense Refill  . aspirin 325 MG tablet Take 325 mg by mouth daily.        Marland Kitchen atorvastatin (LIPITOR) 80 MG tablet TAKE 1 TABLET BY MOUTH ONCE DAILY  30 tablet  3  . bisoprolol (ZEBETA) 10 MG tablet Take 5 mg by mouth daily.      . clopidogrel (PLAVIX) 75 MG tablet TAKE 1 TABLET BY MOUTH DAILY  30 tablet  6  . digoxin (LANOXIN) 0.125 MG tablet TAKE ONE TABLET BY MOUTH EVERY DAY  15 tablet  3  . fish oil-omega-3 fatty acids 1000 MG  capsule Take 2 g by mouth daily.        . furosemide (LASIX) 40 MG tablet TAKE 1 TABLET BY MOUTH TWICE DAILY  60 tablet  6  . glucose blood test strip 1 each 2 (two) times daily. Use as instructed       . Insulin Detemir 100 UNIT/ML SOPN Inject 230 Units into the skin every morning.  75 mL  11  . lisinopril (PRINIVIL,ZESTRIL) 10 MG tablet TAKE 1 TABLET BY MOUTH TWICE DAILY  60 tablet  6  . Multiple Vitamins-Minerals (MENS MULTI VITAMIN & MINERAL PO) Take by mouth. 1 tablet daily.       . pantoprazole (PROTONIX) 40 MG tablet TAKE 1 TABLET BY MOUTH DAILY  30 tablet  6  . potassium chloride SA (K-DUR,KLOR-CON) 20 MEQ tablet TAKE 1 TABLET BY MOUTH DAILY  30 tablet  6  . pregabalin (LYRICA) 50 MG capsule Take 75 mg by mouth 2 (two) times daily.      . promethazine-dextromethorphan (PROMETHAZINE-DM) 6.25-15 MG/5ML syrup Take 5 mLs by mouth 4 (four) times daily as needed for cough.  118 mL  0  . spironolactone (ALDACTONE) 25 MG tablet TAKE 1 TABLET BY MOUTH DAILY  30 tablet  5   No current facility-administered medications for this encounter.   History   Social History  . Marital Status:  Married    Spouse Name: N/A    Number of Children: N/A  . Years of Education: N/A   Social History Main Topics  . Smoking status: Former Smoker -- 1.50 packs/day for 20 years  . Smokeless tobacco: Never Used  . Alcohol Use: No  . Drug Use: Yes    Special: Marijuana  . Sexual Activity: Yes    Birth Control/ Protection: Condom   Other Topics Concern  . Not on file   Social History Narrative   Regular Exercise -  NO   Family History  Problem Relation Age of Onset  . Hyperlipidemia Mother   . Hypertension Father   . Diabetes Father   . Heart disease Father   . Cancer Neg Hx   . COPD Neg Hx   . Drug abuse Neg Hx   . Hearing loss Neg Hx   . Kidney disease Neg Hx   . Stroke Neg Hx   . Alcohol abuse Neg Hx      Filed Vitals:   11/11/13 1032  BP: 98/62  Pulse: 77  Weight: 303 lb (137.44 kg)   SpO2: 97%   PHYSICAL EXAM General:  Fatigued appearing. no resp difficulty HEENT: normal Neck: supple.  JVP 8. Carotids 2+ bilat; no bruits. No lymphadenopathy or thryomegaly appreciated. Cor: PMI nondisplaced. Regular rate & rhythm. No rubs, gallops, murmur. Lungs: clear Abdomen: obese, soft, nontender, nondistended. No hepatosplenomegaly. No bruits or masses. Good bowel sounds. Extremities: no cyanosis, clubbing, rash,  3+ RLE and LLE edema Neuro: alert & orientedx3, cranial nerves grossly intact. moves all 4 extremities w/o difficulty. affect normal   ASSESSMENT & PLAN: 1. Chronic Systolic Heart Failure ECHO 01/2013 EF 40-45% 2. CAD- Cardiac catheterization 2009 showed severe three- vessel coronary artery disease which was not amenable to bypass surgery due to severe distal disease. Angioplasty of a portion of his LAD.  3. DM - Followed by Dr Everardo AllEllison. 4. Hyperlipidemia/Hypertriglyceridemia- Followed by Dr. Everardo AllEllison.   He is markedly volume overloaded with NYHA IIIB symptoms. I recommended hospital admission but he needs to take care of his parents at home. We will try aggressive ou patient management for a day or two and if not responding quickly will admit for treatment of HF and possible RHC. He knows to call if not improving quickly.   Change lasix to 80 mg bid. Add metolazone 5mg  daily for 3 days. Take KCL 40 am and 20 pm while on extra lasix.  Check BMET today. See back early next week.   Total time spent 50 minutes with over half that time spent discussing above issues.  Krithi Bray,MD 10:53 AM

## 2013-11-11 NOTE — Patient Instructions (Signed)
Increase Lasix to 80 mg Twice daily   Increase Potassium to 2 tabs in AM and 1 tab in PM  Take Metolazone 5 mg for 3 days  Lab today  Your physician recommends that you schedule a follow-up appointment in: 1 week

## 2013-11-15 ENCOUNTER — Other Ambulatory Visit (HOSPITAL_COMMUNITY): Payer: Self-pay | Admitting: Anesthesiology

## 2013-11-15 ENCOUNTER — Encounter: Payer: Self-pay | Admitting: Internal Medicine

## 2013-11-15 ENCOUNTER — Ambulatory Visit (INDEPENDENT_AMBULATORY_CARE_PROVIDER_SITE_OTHER): Payer: BC Managed Care – PPO | Admitting: Internal Medicine

## 2013-11-15 ENCOUNTER — Other Ambulatory Visit (INDEPENDENT_AMBULATORY_CARE_PROVIDER_SITE_OTHER): Payer: BC Managed Care – PPO

## 2013-11-15 VITALS — BP 100/62 | HR 70 | Temp 98.3°F | Resp 16 | Ht 73.0 in | Wt 290.0 lb

## 2013-11-15 DIAGNOSIS — E1149 Type 2 diabetes mellitus with other diabetic neurological complication: Secondary | ICD-10-CM

## 2013-11-15 DIAGNOSIS — I5022 Chronic systolic (congestive) heart failure: Secondary | ICD-10-CM

## 2013-11-15 DIAGNOSIS — I10 Essential (primary) hypertension: Secondary | ICD-10-CM

## 2013-11-15 LAB — HEMOGLOBIN A1C: Hgb A1c MFr Bld: 7.4 % — ABNORMAL HIGH (ref 4.6–6.5)

## 2013-11-15 MED ORDER — LOSARTAN POTASSIUM 100 MG PO TABS: 100.0000 mg | ORAL_TABLET | Freq: Every day | ORAL | Status: DC

## 2013-11-15 NOTE — Progress Notes (Signed)
Subjective:    Patient ID: [name] Foley, male    DOB: [birth year], 38 y.o.   MRN: [id number]  Diabetes He presents for his follow-up diabetic visit. He has type 2 diabetes mellitus. His disease course has been improving. There are no hypoglycemic associated symptoms. Pertinent negatives for hypoglycemia include no dizziness, headaches or tremors. Pertinent negatives for diabetes include no blurred vision, no chest pain, no fatigue, no foot paresthesias, no foot ulcerations, no polydipsia, no polyphagia, no polyuria, no visual change, no weakness and no weight loss. Diabetic complications include heart disease and peripheral neuropathy. Current diabetic treatment includes insulin injections. He is compliant with treatment all of the time. His weight is stable. He is following a generally healthy diet. When asked about meal planning, he reported none. He participates in exercise intermittently. His home blood glucose trend is decreasing steadily. An ACE inhibitor/angiotensin II receptor blocker is being taken. He does not see a podiatrist.Eye exam is current.      Review of Systems  Constitutional: Negative.  Negative for fever, chills, weight loss, diaphoresis, appetite change and fatigue.  HENT: Negative.   Eyes: Negative.  Negative for blurred vision.  Respiratory: Positive for cough. Negative for apnea, choking, chest tightness, shortness of breath, wheezing and stridor.        He has had a mild intermittent NP cough  Cardiovascular: Negative.  Negative for chest pain, palpitations and leg swelling.  Gastrointestinal: Negative.  Negative for nausea, vomiting, abdominal pain, diarrhea, constipation and blood in stool.  Endocrine: Negative.  Negative for polydipsia, polyphagia and polyuria.  Genitourinary: Negative.   Musculoskeletal: Negative.  Negative for arthralgias, back pain, myalgias, neck pain and neck stiffness.  Skin: Negative.   Allergic/Immunologic: Negative.   Neurological:  Negative.  Negative for dizziness, tremors, weakness, light-headedness, numbness and headaches.  Hematological: Negative.  Negative for adenopathy. Does not bruise/bleed easily.  Psychiatric/Behavioral: Negative.        Objective:   Physical Exam  Vitals reviewed. Constitutional: He is oriented to person, place, and time. He appears well-developed and well-nourished. No distress.  HENT:  Head: Normocephalic and atraumatic.  Mouth/Throat: Oropharynx is clear and moist. No oropharyngeal exudate.  Eyes: Conjunctivae are normal. Right eye exhibits no discharge. Left eye exhibits no discharge. No scleral icterus.  Neck: Normal range of motion. Neck supple. No JVD present. No tracheal deviation present. No thyromegaly present.  Cardiovascular: Normal rate, regular rhythm, normal heart sounds and intact distal pulses.  Exam reveals no gallop and no friction rub.   No murmur heard. Pulmonary/Chest: Effort normal and breath sounds normal. No stridor. No respiratory distress. He has no wheezes. He has no rales. He exhibits no tenderness.  Abdominal: Soft. Bowel sounds are normal. He exhibits no distension and no mass. There is no tenderness. There is no rebound and no guarding.  Musculoskeletal: Normal range of motion. He exhibits no edema and no tenderness.  Lymphadenopathy:    He has no cervical adenopathy.  Neurological: He is oriented to person, place, and time.  Skin: Skin is warm and dry. No rash noted. He is not diaphoretic. No erythema. No pallor.  Psychiatric: He has a normal mood and affect. His behavior is normal. Judgment and thought content normal.      Lab Results  Component Value Date   WBC 8.6 11/19/2012   HGB 15.7 11/19/2012   HCT 46.0 11/19/2012   PLT 259.0 11/19/2012   GLUCOSE 169* 11/11/2013   CHOL 125 04/21/2013   TRIG 164.0*  04/21/2013   HDL 27.10* 04/21/2013   LDLDIRECT 41.4 01/18/2013   LDLCALC 65 04/21/2013   ALT 29 11/19/2012   AST 23 11/19/2012   NA 141 11/11/2013   K 5.0  11/11/2013   CL 104 11/11/2013   CREATININE 1.28 11/11/2013   BUN 37* 11/11/2013   CO2 25 11/11/2013   TSH 0.63 11/19/2012   INR 1.0 RATIO 11/12/2007   HGBA1C 9.0* 07/05/2013   MICROALBUR 2.9* 11/19/2012      Assessment & Plan:

## 2013-11-15 NOTE — Progress Notes (Signed)
Pre visit review using our clinic review tool, if applicable. No additional management support is needed unless otherwise documented below in the visit note. 

## 2013-11-15 NOTE — Patient Instructions (Signed)
Type 2 Diabetes Mellitus, Adult Type 2 diabetes mellitus, often simply referred to as type 2 diabetes, is a long-lasting (chronic) disease. In type 2 diabetes, the pancreas does not make enough insulin (a hormone), the cells are less responsive to the insulin that is made (insulin resistance), or both. Normally, insulin moves sugars from food into the tissue cells. The tissue cells use the sugars for energy. The lack of insulin or the lack of normal response to insulin causes excess sugars to build up in the blood instead of going into the tissue cells. As a result, high blood sugar (hyperglycemia) develops. The effect of high sugar (glucose) levels can cause many complications. Type 2 diabetes was also previously called adult-onset diabetes but it can occur at any age.  RISK FACTORS  A person is predisposed to developing type 2 diabetes if someone in the family has the disease and also has one or more of the following primary risk factors:  Overweight.  An inactive lifestyle.  A history of consistently eating high-calorie foods. Maintaining a normal weight and regular physical activity can reduce the chance of developing type 2 diabetes. SYMPTOMS  A person with type 2 diabetes may not show symptoms initially. The symptoms of type 2 diabetes appear slowly. The symptoms include:  Increased thirst (polydipsia).  Increased urination (polyuria).  Increased urination during the night (nocturia).  Weight loss. This weight loss may be rapid.  Frequent, recurring infections.  Tiredness (fatigue).  Weakness.  Vision changes, such as blurred vision.  Fruity smell to your breath.  Abdominal pain.  Nausea or vomiting.  Cuts or bruises which are slow to heal.  Tingling or numbness in the hands or feet. DIAGNOSIS Type 2 diabetes is frequently not diagnosed until complications of diabetes are present. Type 2 diabetes is diagnosed when symptoms or complications are present and when blood  glucose levels are increased. Your blood glucose level may be checked by one or more of the following blood tests:  A fasting blood glucose test. You will not be allowed to eat for at least 8 hours before a blood sample is taken.  A random blood glucose test. Your blood glucose is checked at any time of the day regardless of when you ate.  A hemoglobin A1c blood glucose test. A hemoglobin A1c test provides information about blood glucose control over the previous 3 months.  An oral glucose tolerance test (OGTT). Your blood glucose is measured after you have not eaten (fasted) for 2 hours and then after you drink a glucose-containing beverage. TREATMENT   You may need to take insulin or diabetes medicine daily to keep blood glucose levels in the desired range.  You will need to match insulin dosing with exercise and healthy food choices. The treatment goal is to maintain the before meal blood sugar (preprandial glucose) level at 70 130 mg/dL. HOME CARE INSTRUCTIONS   Have your hemoglobin A1c level checked twice a year.  Perform daily blood glucose monitoring as directed by your caregiver.  Monitor urine ketones when you are ill and as directed by your caregiver.  Take your diabetes medicine or insulin as directed by your caregiver to maintain your blood glucose levels in the desired range.  Never run out of diabetes medicine or insulin. It is needed every day.  Adjust insulin based on your intake of carbohydrates. Carbohydrates can raise blood glucose levels but need to be included in your diet. Carbohydrates provide vitamins, minerals, and fiber which are an essential part of   a healthy diet. Carbohydrates are found in fruits, vegetables, whole grains, dairy products, legumes, and foods containing added sugars.    Eat healthy foods. Alternate 3 meals with 3 snacks.  Lose weight if overweight.  Carry a medical alert card or wear your medical alert jewelry.  Carry a 15 gram  carbohydrate snack with you at all times to treat low blood glucose (hypoglycemia). Some examples of 15 gram carbohydrate snacks include:  Glucose tablets, 3 or 4   Glucose gel, 15 gram tube  Raisins, 2 tablespoons (24 grams)  Jelly beans, 6  Animal crackers, 8  Regular pop, 4 ounces (120 mL)  Gummy treats, 9  Recognize hypoglycemia. Hypoglycemia occurs with blood glucose levels of 70 mg/dL and below. The risk for hypoglycemia increases when fasting or skipping meals, during or after intense exercise, and during sleep. Hypoglycemia symptoms can include:  Tremors or shakes.  Decreased ability to concentrate.  Sweating.  Increased heart rate.  Headache.  Dry mouth.  Hunger.  Irritability.  Anxiety.  Restless sleep.  Altered speech or coordination.  Confusion.  Treat hypoglycemia promptly. If you are alert and able to safely swallow, follow the 15:15 rule:  Take 15 20 grams of rapid-acting glucose or carbohydrate. Rapid-acting options include glucose gel, glucose tablets, or 4 ounces (120 mL) of fruit juice, regular soda, or low fat milk.  Check your blood glucose level 15 minutes after taking the glucose.  Take 15 20 grams more of glucose if the repeat blood glucose level is still 70 mg/dL or below.  Eat a meal or snack within 1 hour once blood glucose levels return to normal.    Be alert to polyuria and polydipsia which are early signs of hyperglycemia. An early awareness of hyperglycemia allows for prompt treatment. Treat hyperglycemia as directed by your caregiver.  Engage in at least 150 minutes of moderate-intensity physical activity a week, spread over at least 3 days of the week or as directed by your caregiver. In addition, you should engage in resistance exercise at least 2 times a week or as directed by your caregiver.  Adjust your medicine and food intake as needed if you start a new exercise or sport.  Follow your sick day plan at any time you  are unable to eat or drink as usual.  Avoid tobacco use.  Limit alcohol intake to no more than 1 drink per day for nonpregnant women and 2 drinks per day for men. You should drink alcohol only when you are also eating food. Talk with your caregiver whether alcohol is safe for you. Tell your caregiver if you drink alcohol several times a week.  Follow up with your caregiver regularly.  Schedule an eye exam soon after the diagnosis of type 2 diabetes and then annually.  Perform daily skin and foot care. Examine your skin and feet daily for cuts, bruises, redness, nail problems, bleeding, blisters, or sores. A foot exam by a caregiver should be done annually.  Brush your teeth and gums at least twice a day and floss at least once a day. Follow up with your dentist regularly.  Share your diabetes management plan with your workplace or school.  Stay up-to-date with immunizations.  Learn to manage stress.  Obtain ongoing diabetes education and support as needed.  Participate in, or seek rehabilitation as needed to maintain or improve independence and quality of life. Request a physical or occupational therapy referral if you are having foot or hand numbness or difficulties with grooming,   dressing, eating, or physical activity. SEEK MEDICAL CARE IF:   You are unable to eat food or drink fluids for more than 6 hours.  You have nausea and vomiting for more than 6 hours.  Your blood glucose level is over 240 mg/dL.  There is a change in mental status.  You develop an additional serious illness.  You have diarrhea for more than 6 hours.  You have been sick or have had a fever for a couple of days and are not getting better.  You have pain during any physical activity.  SEEK IMMEDIATE MEDICAL CARE IF:  You have difficulty breathing.  You have moderate to large ketone levels. MAKE SURE YOU:  Understand these instructions.  Will watch your condition.  Will get help right away if  you are not doing well or get worse. Document Released: 08/05/2005 Document Revised: 04/29/2012 Document Reviewed: 03/03/2012 [employer] Patient Information 2014 [employer].  

## 2013-11-16 ENCOUNTER — Ambulatory Visit (HOSPITAL_COMMUNITY)
Admission: RE | Admit: 2013-11-16 | Discharge: 2013-11-16 | Disposition: A | Discharge status: Home / Self Care | Payer: BC Managed Care – PPO | Source: Ambulatory Visit | Attending: Cardiology | Admitting: Cardiology

## 2013-11-16 ENCOUNTER — Telehealth (HOSPITAL_COMMUNITY): Payer: Self-pay | Admitting: Adult Health

## 2013-11-16 ENCOUNTER — Encounter (HOSPITAL_COMMUNITY): Payer: Self-pay

## 2013-11-16 VITALS — BP 82/54 | HR 77 | Wt 291.8 lb

## 2013-11-16 DIAGNOSIS — I5022 Chronic systolic (congestive) heart failure: Secondary | ICD-10-CM

## 2013-11-16 DIAGNOSIS — E785 Hyperlipidemia, unspecified: Secondary | ICD-10-CM | POA: Insufficient documentation

## 2013-11-16 DIAGNOSIS — I2510 Atherosclerotic heart disease of native coronary artery without angina pectoris: Secondary | ICD-10-CM | POA: Diagnosis not present

## 2013-11-16 LAB — BASIC METABOLIC PANEL
BUN: 48 mg/dL — ABNORMAL HIGH (ref 6–23)
CO2: 26 meq/L (ref 19–32)
Calcium: 9.2 mg/dL (ref 8.4–10.5)
Chloride: 101 mEq/L (ref 96–112)
Creatinine, Ser: 1.83 mg/dL — ABNORMAL HIGH (ref 0.50–1.35)
GFR calc Af Amer: 53 mL/min — ABNORMAL LOW (ref >90)
GFR calc non Af Amer: 46 mL/min — ABNORMAL LOW (ref >90)
Glucose, Bld: 203 mg/dL — ABNORMAL HIGH (ref 70–99)
Potassium: 6.2 mEq/L — ABNORMAL HIGH (ref 3.7–5.3)
SODIUM: 139 meq/L (ref 137–147)

## 2013-11-16 LAB — DIGOXIN LEVEL: DIGOXIN LVL: 1.1 ng/mL (ref 0.8–2.0)

## 2013-11-16 MED ORDER — LISINOPRIL 5 MG PO TABS: 5.0000 mg | ORAL_TABLET | Freq: Two times a day (BID) | ORAL | Status: DC

## 2013-11-16 MED ORDER — DIGOXIN 125 MCG PO TABS: ORAL_TABLET | ORAL | Status: AC

## 2013-11-16 NOTE — Assessment & Plan Note (Signed)
His BP is well controlled but he has developed a cough, presumed due to the ACEI Will change to am ARB, he will cont the other meds without change

## 2013-11-16 NOTE — Patient Instructions (Signed)
Stop Metolazone  Start Lisinopril 5 mg Twice daily   Labs today  Your physician recommends that you schedule a follow-up appointment in: 2 weeks

## 2013-11-16 NOTE — Assessment & Plan Note (Signed)
The A1C shows that his blood sugars are much improved

## 2013-11-16 NOTE — Assessment & Plan Note (Signed)
Will stop the ACEI due to the cough, will change to an ARB His volume status is excellent today

## 2013-11-16 NOTE — Telephone Encounter (Signed)
Provided with lab results. K 6.2 Creatinine 1.8  Instructed to stop potassium and hold pm lasix tonight.  [facility].   Mr [name] Foley verbalized understanding and appreciated the phone call.   Travis Mastel 4:23 PM

## 2013-11-17 ENCOUNTER — Telehealth (HOSPITAL_COMMUNITY): Payer: Self-pay | Admitting: *Deleted

## 2013-11-17 DIAGNOSIS — I5022 Chronic systolic (congestive) heart failure: Secondary | ICD-10-CM

## 2013-11-17 NOTE — Telephone Encounter (Signed)
Message copied by Noralee SpaceSCHUB, Reighn Kaplan M on Wed Nov 17, 2013  3:11 PM ------      Message from: Laurey MoraleMCLEAN, DALTON S      Created: Wed Nov 17, 2013 10:16 AM       High K, high creatinine.  Stop spironolactone, KCl, and lisinopril.  Repeat BMET on Friday to decide on restarting.  Hold Lasix x 2 days.  No more metolazone. ------

## 2013-11-17 NOTE — Progress Notes (Signed)
Patient ID: [name] Foley, male   DOB: January 24, 1976, 38 y.o.   MRN: [id number]  PCP: Dr [name] Foley: Dr [name] Foley  HPI: [name] Foley is a 38 year old male with a history of morbid obesity, hypertension, diabetes, and congestive heart failure secondary to severe ischemic cardiomyopathy. Most recent EF 40-45%. Cardiac catheterization 2009 year showed severe three- vessel coronary artery disease which was not amenable to bypass surgery due to severe distal disease.  He did undergo angioplasty of a portion of his LAD.   11/2009 Echo  EF 40%. 04/2011 Echo EF 20-25% 02/13/12 ECHO EF 30-35% 02/09/13 ECHO EF ~ 40-45% Dr [name] Foley reviewed personally   Follow up: At last appointment (last Thursday), he had gained around 20 lbs and was very short of breath.  He took metolazone 3 days in a row, then again today.  Lasix was increased to 80 mg bid.  His weight is down 12 lbs.  He was told to stop lisinopril by his PCP and start losartan because of a cough but he does not think the cough is from lisinopril so did not stop it.  He feels better today than last Thursday.  He does get lightheaded when he stands and BP is 82/54 today.  He says that his SBP has been in the 80s-90s x months. He is not short of breath walking on flat ground now.  No chest pain, no orthopnea.  Still dyspneic with incline.   Labs (3/15): K 5, creatinine 1.28 Labs (4/15): K 6.2, creatinine 1.83, digoxin 1.1  ROS: All systems negative except as listed in HPI, PMH and Problem List.  Past Medical History  Diagnosis Date  . CHF (congestive heart failure)     Secondary to ischemic cardiomyopathy  . CAD (coronary artery disease)     VERY Severe  . Morbid obesity   . Type II or unspecified type diabetes mellitus without mention of complication, not stated as uncontrolled   . Hyperlipidemia   . GERD (gastroesophageal reflux disease)     Current Outpatient Prescriptions  Medication Sig Dispense Refill  . aspirin 325 MG tablet Take 325 mg  by mouth daily.        Marland Kitchen. atorvastatin (LIPITOR) 80 MG tablet TAKE 1 TABLET BY MOUTH ONCE DAILY  30 tablet  3  . bisoprolol (ZEBETA) 10 MG tablet Take 5 mg by mouth daily.      . clopidogrel (PLAVIX) 75 MG tablet TAKE 1 TABLET BY MOUTH DAILY  30 tablet  6  . digoxin (DIGOX) 0.125 MG tablet TAKE 1 TABLET BY MOUTH EVERY DAY  30 tablet  6  . fish oil-omega-3 fatty acids 1000 MG capsule Take 2 g by mouth daily.        . furosemide (LASIX) 80 MG tablet Take 1 tablet (80 mg total) by mouth 2 (two) times daily.  60 tablet  6  . glucose blood test strip 1 each 2 (two) times daily. Use as instructed       . Insulin Detemir 100 UNIT/ML SOPN Inject 230 Units into the skin every morning.  75 mL  11  . metolazone (ZAROXOLYN) 5 MG tablet Take 1 tablet (5 mg total) by mouth as directed.  10 tablet  3  . Multiple Vitamins-Minerals (MENS MULTI VITAMIN & MINERAL PO) Take by mouth. 1 tablet daily.       . pantoprazole (PROTONIX) 40 MG tablet TAKE 1 TABLET BY MOUTH DAILY  30 tablet  6  . potassium chloride SA (K-DUR,KLOR-CON) 20 MEQ tablet  Take 2 tabs in AM and 1 tab in PM  90 tablet  6  . pregabalin (LYRICA) 50 MG capsule Take 75 mg by mouth 2 (two) times daily.      Marland Kitchen spironolactone (ALDACTONE) 25 MG tablet TAKE 1 TABLET BY MOUTH DAILY  30 tablet  5  . lisinopril (PRINIVIL,ZESTRIL) 5 MG tablet Take 1 tablet (5 mg total) by mouth 2 (two) times daily.  90 tablet  3   No current facility-administered medications for this encounter.   History   Social History  . Marital Status: Married    Spouse Name: N/A    Number of Children: N/A  . Years of Education: N/A   Social History Main Topics  . Smoking status: Former Smoker -- 1.50 packs/day for 20 years  . Smokeless tobacco: Never Used  . Alcohol Use: No  . Drug Use: Yes    Special: Marijuana  . Sexual Activity: Yes    Birth Control/ Protection: Condom   Other Topics Concern  . None   Social History Narrative   Regular Exercise -  NO   Family History   Problem Relation Age of Onset  . Hyperlipidemia Mother   . Hypertension Father   . Diabetes Father   . Heart disease Father   . Cancer Neg Hx   . COPD Neg Hx   . Drug abuse Neg Hx   . Hearing loss Neg Hx   . Kidney disease Neg Hx   . Stroke Neg Hx   . Alcohol abuse Neg Hx      Filed Vitals:   11/16/13 1428  BP: 82/54  Pulse: 77  Weight: 291 lb 12.8 oz (132.36 kg)  SpO2: 100%   PHYSICAL EXAM General:  Fatigued appearing. no resp difficulty HEENT: normal Neck: supple.  JVP not elevated. Carotids 2+ bilat; no bruits. No lymphadenopathy or thryomegaly appreciated. Cor: PMI nondisplaced. Regular rate & rhythm. No rubs, gallops, murmur. Lungs: clear Abdomen: obese, soft, nontender, nondistended. No hepatosplenomegaly. No bruits or masses. Good bowel sounds. Extremities: no cyanosis, clubbing, rash,  1+ edema 1/3 to knees bilaterally.  Neuro: alert & orientedx3, cranial nerves grossly intact. moves all 4 extremities w/o difficulty. affect normal   ASSESSMENT & PLAN: 1. Chronic Systolic Heart Failure: Ischemic cardiomyopathy, ECHO 01/2013 EF 40-45%.  Currently, improved NYHA class II-III symptoms.  No more than mild volume overload (JVP not elevated).  He has lost 12 lbs.  Labs were sent after patient went home: K was high at 6.2 and creatinine up to 1.8.  He is lightheaded with standing and SBP is in the 80s.  I suspect he has been a bit overdiuresed.  - No more metolazone.   - Hold Lasix for 2 days then restart 80 mg bid.  - Stop spironolactone, KCl, and lisinopril for now.  Will repeat BMET on Friday and will need to decide what to do with these medications.  - Can continue bisoprolol and digoxin at current doses for now.  2. CAD: Cardiac catheterization 2009 showed severe three- vessel coronary artery disease which was not amenable to bypass surgery due to severe distal disease. Angioplasty of a portion of his LAD. Continue ASA and statin.  3. DM: Followed by Dr [name] All. 4.  Hyperlipidemia/Hypertriglyceridemia: Followed by Dr. Everardo All. Goal LDL < 70.   [name] Ninneman,MD 11/17/2013

## 2013-11-19 ENCOUNTER — Ambulatory Visit (INDEPENDENT_AMBULATORY_CARE_PROVIDER_SITE_OTHER): Payer: BC Managed Care – PPO | Admitting: *Deleted

## 2013-11-19 DIAGNOSIS — E785 Hyperlipidemia, unspecified: Secondary | ICD-10-CM

## 2013-11-19 DIAGNOSIS — I10 Essential (primary) hypertension: Secondary | ICD-10-CM

## 2013-11-19 LAB — BASIC METABOLIC PANEL
BUN: 39 mg/dL — ABNORMAL HIGH (ref 6–23)
CO2: 24 meq/L (ref 19–32)
Calcium: 8.9 mg/dL (ref 8.4–10.5)
Chloride: 104 mEq/L (ref 96–112)
Creat: 1.38 mg/dL — ABNORMAL HIGH (ref 0.50–1.35)
Glucose, Bld: 264 mg/dL — ABNORMAL HIGH (ref 70–99)
Potassium: 4.9 mEq/L (ref 3.5–5.3)
SODIUM: 138 meq/L (ref 135–145)

## 2013-11-22 ENCOUNTER — Telehealth (HOSPITAL_COMMUNITY): Payer: Self-pay | Admitting: *Deleted

## 2013-11-22 DIAGNOSIS — I5022 Chronic systolic (congestive) heart failure: Secondary | ICD-10-CM

## 2013-11-22 MED ORDER — LISINOPRIL 5 MG PO TABS: 2.5000 mg | ORAL_TABLET | Freq: Two times a day (BID) | ORAL | Status: DC

## 2013-11-22 NOTE — Telephone Encounter (Signed)
Per Dr Gala RomneyBensimhon, labs improved restart Lisinopril 2.5 mg BID, recheck labs in 1 week pt aware and agreeable labs sch for 4/10

## 2013-11-26 ENCOUNTER — Other Ambulatory Visit (INDEPENDENT_AMBULATORY_CARE_PROVIDER_SITE_OTHER): Payer: BC Managed Care – PPO

## 2013-11-26 DIAGNOSIS — I5022 Chronic systolic (congestive) heart failure: Secondary | ICD-10-CM

## 2013-11-26 LAB — BASIC METABOLIC PANEL
BUN: 57 mg/dL — ABNORMAL HIGH (ref 6–23)
CO2: 26 meq/L (ref 19–32)
CREATININE: 1.7 mg/dL — AB (ref 0.4–1.5)
Calcium: 8.8 mg/dL (ref 8.4–10.5)
Chloride: 101 mEq/L (ref 96–112)
GFR: 47.1 mL/min — ABNORMAL LOW (ref >60.00)
Glucose, Bld: 122 mg/dL — ABNORMAL HIGH (ref 70–99)
Potassium: 4 mEq/L (ref 3.5–5.1)
SODIUM: 137 meq/L (ref 135–145)

## 2013-11-30 ENCOUNTER — Telehealth (HOSPITAL_COMMUNITY): Payer: Self-pay

## 2013-11-30 MED ORDER — FUROSEMIDE 80 MG PO TABS: 80.0000 mg | ORAL_TABLET | Freq: Every day | ORAL | Status: AC

## 2013-11-30 NOTE — Telephone Encounter (Signed)
Patient made aware of lab results, instructed to hold lasix x 2 days, then resume 80mg  once daily on Thursday.  BMET recheck scheduled for 1 wk, aware and agreeable. Ave FilterMegan Genevea Bradley

## 2013-12-02 ENCOUNTER — Ambulatory Visit (HOSPITAL_COMMUNITY)
Admission: RE | Admit: 2013-12-02 | Discharge: 2013-12-02 | Disposition: A | Discharge status: Home / Self Care | Payer: BC Managed Care – PPO | Source: Ambulatory Visit | Attending: Internal Medicine | Admitting: Internal Medicine

## 2013-12-02 VITALS — BP 106/56 | HR 74 | Wt 296.5 lb

## 2013-12-02 DIAGNOSIS — N189 Chronic kidney disease, unspecified: Secondary | ICD-10-CM | POA: Insufficient documentation

## 2013-12-02 DIAGNOSIS — N19 Unspecified kidney failure: Secondary | ICD-10-CM

## 2013-12-02 DIAGNOSIS — I2510 Atherosclerotic heart disease of native coronary artery without angina pectoris: Secondary | ICD-10-CM

## 2013-12-02 DIAGNOSIS — I5022 Chronic systolic (congestive) heart failure: Secondary | ICD-10-CM

## 2013-12-02 LAB — BASIC METABOLIC PANEL
BUN: 33 mg/dL — AB (ref 6–23)
CHLORIDE: 101 meq/L (ref 96–112)
CO2: 27 meq/L (ref 19–32)
Calcium: 9.4 mg/dL (ref 8.4–10.5)
Creatinine, Ser: 1.4 mg/dL — ABNORMAL HIGH (ref 0.50–1.35)
GFR calc Af Amer: 73 mL/min — ABNORMAL LOW (ref >90)
GFR calc non Af Amer: 63 mL/min — ABNORMAL LOW (ref >90)
Glucose, Bld: 176 mg/dL — ABNORMAL HIGH (ref 70–99)
POTASSIUM: 4.2 meq/L (ref 3.7–5.3)
Sodium: 142 mEq/L (ref 137–147)

## 2013-12-02 LAB — PRO B NATRIURETIC PEPTIDE: Pro B Natriuretic peptide (BNP): 7682 pg/mL — ABNORMAL HIGH (ref 0–125)

## 2013-12-02 NOTE — Patient Instructions (Signed)
Lab today  Your physician has requested that you have an echocardiogram. Echocardiography is a painless test that uses sound waves to create images of your heart. It provides your doctor with information about the size and shape of your heart and how well your heart's chambers and valves are working. This procedure takes approximately one hour. There are no restrictions for this procedure.  Your physician recommends that you schedule a follow-up appointment in: 1 week

## 2013-12-02 NOTE — Addendum Note (Signed)
Encounter addended by: Noralee SpaceHeather M Devonna Oboyle, RN on: 12/02/2013  1:01 PM<BR>     Documentation filed: Patient Instructions Section, Orders

## 2013-12-02 NOTE — Progress Notes (Signed)
Patient ID: [name] Foley, male   DOB: 05/22/1976, 38 y.o.   MRN: [id number]  PCP: Dr Yetta BarreJones Endocrinologist: Dr Everardo AllEllison  HPI: [name] Foley is a 38 year old male with a history of morbid obesity, hypertension, diabetes, and congestive heart failure secondary to severe ischemic cardiomyopathy. Most recent EF 40-45%. Cardiac catheterization 2009 year showed severe three- vessel coronary artery disease which was not amenable to bypass surgery due to severe distal disease.  He did undergo angioplasty of a portion of his LAD.   11/2009 Echo  EF 40%. 04/2011 Echo EF 20-25% 02/13/12 ECHO EF 30-35% 02/09/13 ECHO EF 40-45%   Follow up: Recently struggled with volume overload. Metolazone added and lasix increased. Saw Dr. Shirlee LatchMclean a few weeks ago and was down 12 pounds. Creatinine and potassium were up so metolazone, lisinopril and spiro stopped. Lasix continued at 80 bid. However recent labs showed Cr was up again so lasix was held for 1 day. Swelling got worse very quickly. So he started lasix back. Fluid now coming off. + ab pain. Feels bad. Fatigues very easily. More DOE. Mother admitted with NSTEMI. Recently being evaluated for possible diabetic retinopathy in L eye. Overwhelmed by all demands on him. Weight up 5 pounds over past 2 weeks. Lisinopril restarted but BP in 60s and he was falling so stopped.   Labs (11/11/13): K 5, creatinine 1.28 11/16/13: K 6.2 Cr 1.83 dig 1.1 11/19/13: K 4.9 Cr 1.38 11/26/13: k 4.0 Cr 1.7   ROS: All systems negative except as listed in HPI, PMH and Problem List.  Past Medical History  Diagnosis Date  . CHF (congestive heart failure)     Secondary to ischemic cardiomyopathy  . CAD (coronary artery disease)     VERY Severe  . Morbid obesity   . Type II or unspecified type diabetes mellitus without mention of complication, not stated as uncontrolled   . Hyperlipidemia   . GERD (gastroesophageal reflux disease)     Current Outpatient Prescriptions  Medication Sig Dispense  Refill  . aspirin 325 MG tablet Take 325 mg by mouth daily.        Marland Kitchen. atorvastatin (LIPITOR) 80 MG tablet TAKE 1 TABLET BY MOUTH ONCE DAILY  30 tablet  3  . bisoprolol (ZEBETA) 10 MG tablet Take 5 mg by mouth daily.      . clopidogrel (PLAVIX) 75 MG tablet TAKE 1 TABLET BY MOUTH DAILY  30 tablet  6  . digoxin (DIGOX) 0.125 MG tablet TAKE 1 TABLET BY MOUTH EVERY DAY  30 tablet  6  . fish oil-omega-3 fatty acids 1000 MG capsule Take 2 g by mouth daily.        . furosemide (LASIX) 80 MG tablet Take 1 tablet (80 mg total) by mouth daily.  60 tablet  6  . glucose blood test strip 1 each 2 (two) times daily. Use as instructed       . Insulin Detemir 100 UNIT/ML SOPN Inject 230 Units into the skin every morning.  75 mL  11  . metolazone (ZAROXOLYN) 5 MG tablet Take 1 tablet (5 mg total) by mouth as directed.  10 tablet  3  . Multiple Vitamins-Minerals (MENS MULTI VITAMIN & MINERAL PO) Take by mouth. 1 tablet daily.       . pantoprazole (PROTONIX) 40 MG tablet TAKE 1 TABLET BY MOUTH DAILY  30 tablet  6  . pregabalin (LYRICA) 50 MG capsule Take 75 mg by mouth 2 (two) times daily.  No current facility-administered medications for this encounter.   History   Social History  . Marital Status: Married    Spouse Name: N/A    Number of Children: N/A  . Years of Education: N/A   Social History Main Topics  . Smoking status: Former Smoker -- 1.50 packs/day for 20 years  . Smokeless tobacco: Never Used  . Alcohol Use: No  . Drug Use: Yes    Special: Marijuana  . Sexual Activity: Yes    Birth Control/ Protection: Condom   Other Topics Concern  . Not on file   Social History Narrative   Regular Exercise -  NO   Family History  Problem Relation Age of Onset  . Hyperlipidemia Mother   . Hypertension Father   . Diabetes Father   . Heart disease Father   . Cancer Neg Hx   . COPD Neg Hx   . Drug abuse Neg Hx   . Hearing loss Neg Hx   . Kidney disease Neg Hx   . Stroke Neg Hx   .  Alcohol abuse Neg Hx      Filed Vitals:   12/02/13 1147  BP: 106/56  Pulse: 74  Weight: 296 lb 8 oz (134.492 kg)  SpO2: 100%   PHYSICAL EXAM General:  Fatigued appearing. no resp difficulty HEENT: normal Neck: supple.  JVP hard to see. Appears mildly elevated. Carotids 2+ bilat; no bruits. No lymphadenopathy or thryomegaly appreciated. Cor: PMI nondisplaced. Regular rate & rhythm. No rubs, gallops, murmur. Lungs: clear Abdomen: obese, soft, nontender, nondistended. No hepatosplenomegaly. No bruits or masses. Good bowel sounds. Extremities: no cyanosis, clubbing, rash,  2+ edema  Neuro: alert & orientedx3, cranial nerves grossly intact. moves all 4 extremities w/o difficulty. affect upset   ASSESSMENT & PLAN: 1. Chronic Systolic Heart Failure: Ischemic cardiomyopathy, ECHO 01/2013 EF 40-45%.   - overall seems more tenuous with cardiorenal syndrome and inability to tolerate lisinopril. NYHA II-III. Still with volume on board.  - Continue current lasix dose as he seems to be responding. Check BMET today. If renal function getting worse will need RHC to assess need for advanced thearpies. If renal function stable will continue with current lasix dose. Repeat echo. Will also likely need CPX soon.  - Can continue bisoprolol and digoxin at current doses for now.  2. CAD: Cardiac catheterization 2009 showed severe three- vessel coronary artery disease which was not amenable to bypass surgery due to severe distal disease. Angioplasty of a portion of his LAD. Continue ASA and statin. No evidence ongoing ischemia currently.  3. DM: Followed by Dr Everardo AllEllison. 4. Hyperlipidemia/Hypertriglyceridemia: Followed by Dr. Everardo AllEllison. Goal LDL < 70.   Bevelyn BucklesDaniel R Bensimhon,MD 12/02/2013

## 2013-12-07 ENCOUNTER — Ambulatory Visit (HOSPITAL_COMMUNITY): Admission: RE | Admit: 2013-12-07 | Payer: BC Managed Care – PPO | Source: Ambulatory Visit

## 2013-12-07 ENCOUNTER — Encounter (HOSPITAL_COMMUNITY): Payer: BC Managed Care – PPO

## 2013-12-14 ENCOUNTER — Telehealth: Payer: Self-pay | Admitting: Internal Medicine

## 2013-12-14 NOTE — Telephone Encounter (Signed)
[facility]Bensimhom Will be Signing, Sending Interoffice to  Herbert SetaHeather S/ Dr.Bensimhon [facility]  4.28.15/kdm

## 2013-12-15 ENCOUNTER — Encounter (HOSPITAL_COMMUNITY): Payer: Self-pay | Admitting: Internal Medicine

## 2013-12-17 DEATH — deceased

## 2014-03-17 ENCOUNTER — Ambulatory Visit: Payer: BC Managed Care – PPO | Admitting: Internal Medicine

## 2014-03-17 DIAGNOSIS — Z0289 Encounter for other administrative examinations: Secondary | ICD-10-CM

## 2014-03-23 ENCOUNTER — Telehealth: Payer: Self-pay | Admitting: Internal Medicine

## 2014-03-23 NOTE — Telephone Encounter (Signed)
Patient no showed for 4 month fu on 07/30.  Please advise.

## 2014-03-23 NOTE — Telephone Encounter (Signed)
noted 

## 2014-06-03 ENCOUNTER — Other Ambulatory Visit: Payer: Self-pay

## 2014-07-04 IMAGING — CR DG CHEST 2V
2 series · 2 of 2 positions shown · non-contrast
Comparison: Chest x-ray of 07/10/2007 and CT angio chest of
09/03/2007

CLINICAL DATA: Cough, shortness of breath, chest pain and fever,
former smoking history

EXAM:
CHEST  2 VIEW

[view not recorded (1 of 2)]
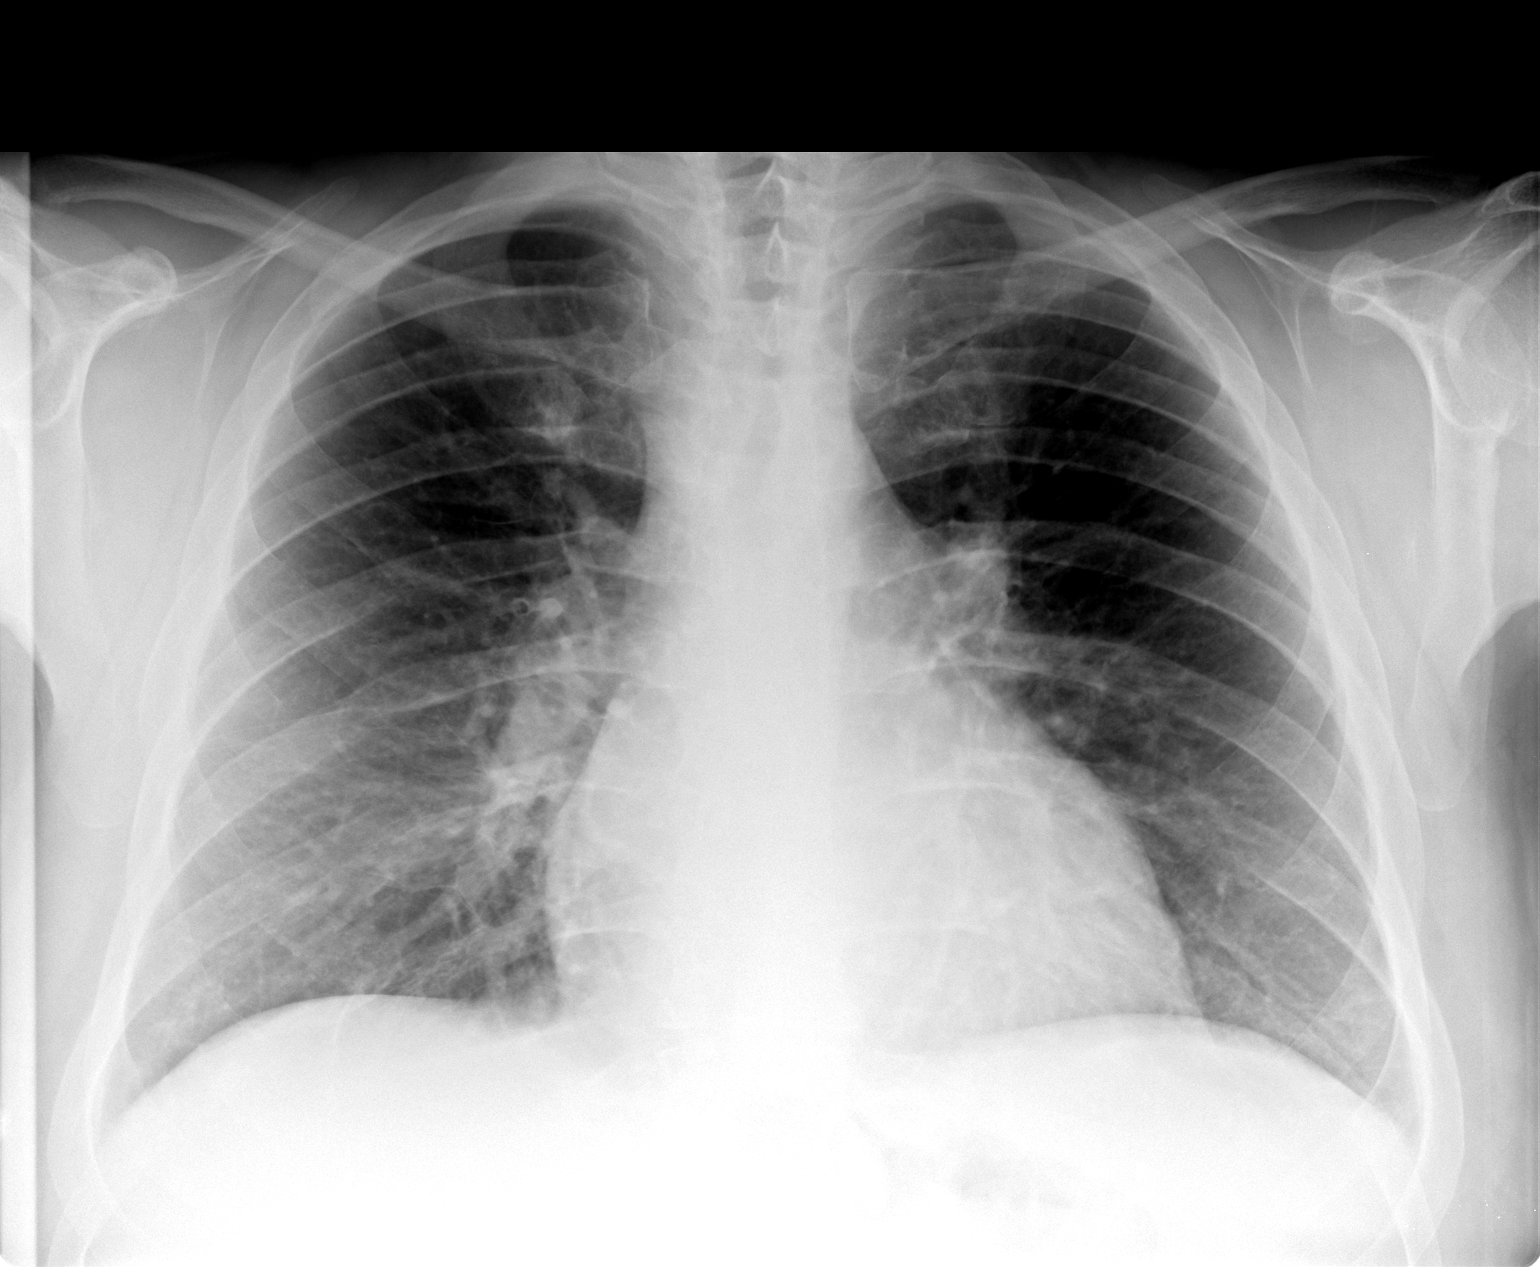

[view not recorded (2 of 2)]
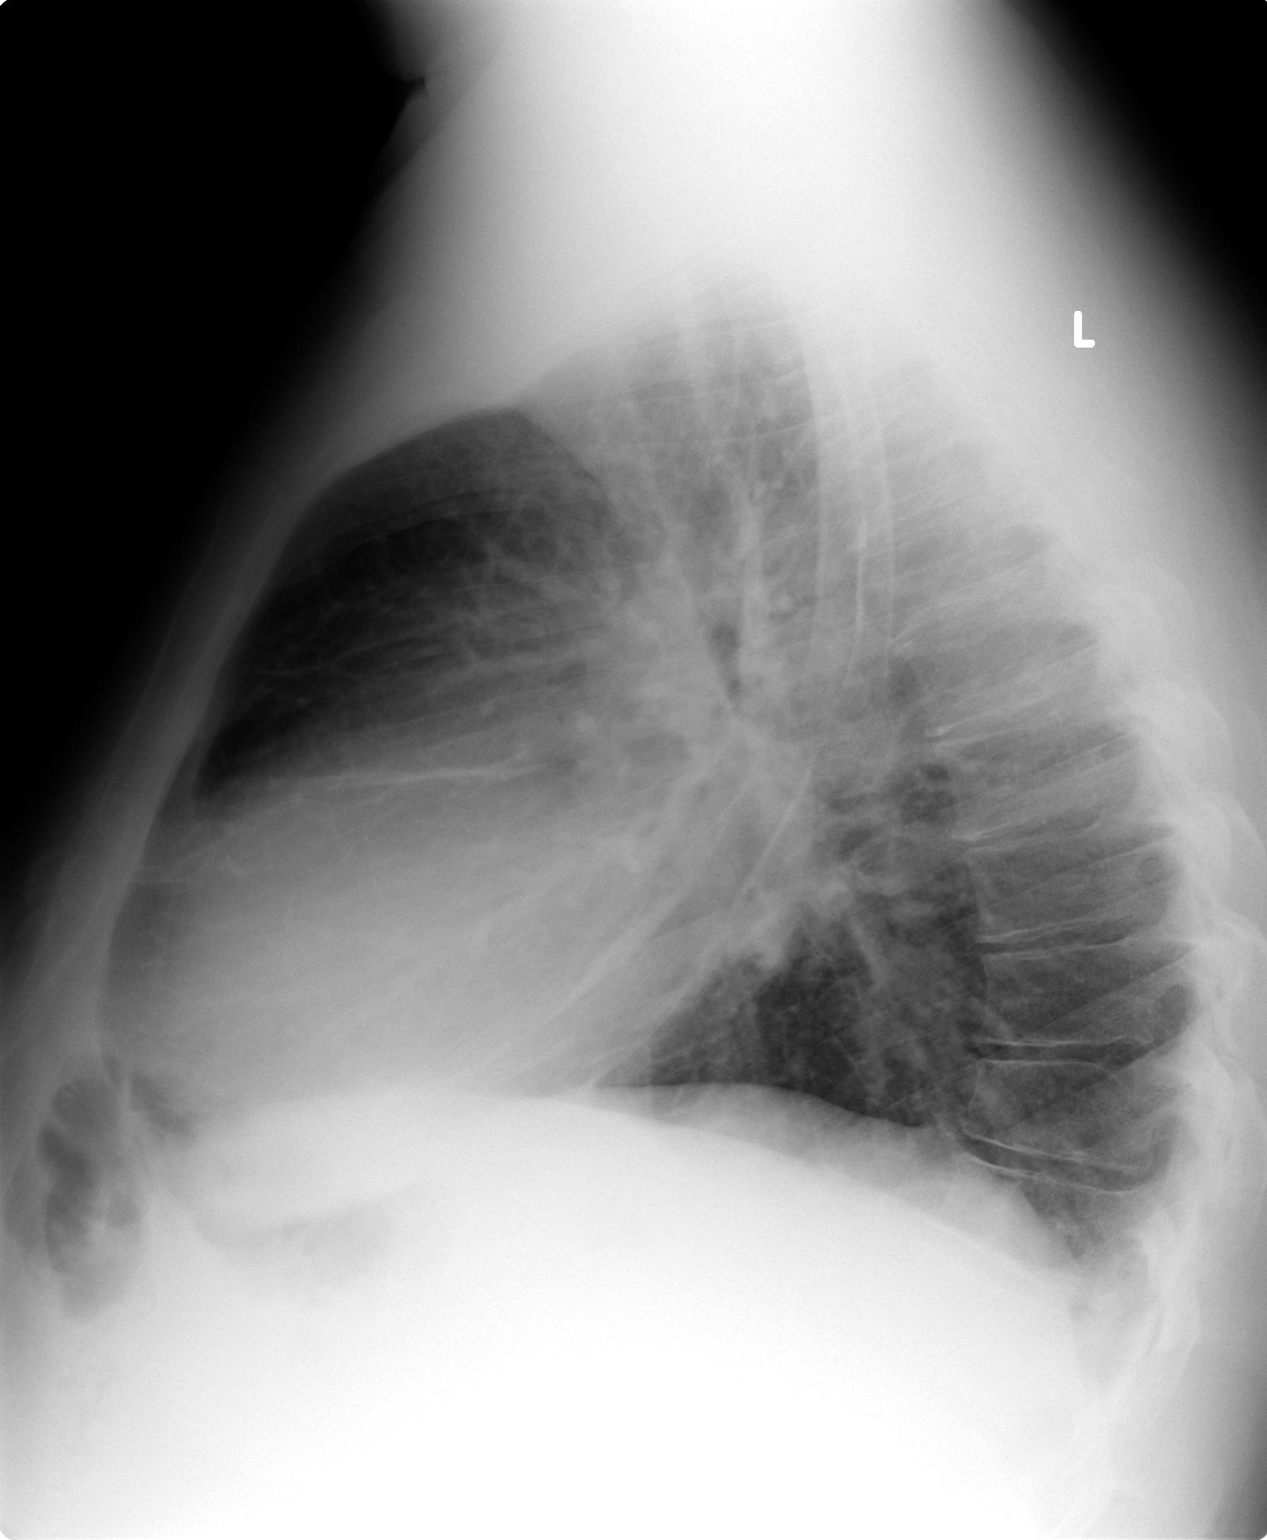

[2 of 2 positions shown; findings below may reference images not displayed]

FINDINGS: Prominent peribronchial thickening again is noted most consistent
with bronchitis most likely chronic. No focal infiltrate or effusion
is seen. Mild cardiomegaly is stable. No bony abnormality is seen.
IMPRESSION: Probable chronic bronchitis. No definite active process. Stable mild
cardiomegaly.

## 2015-02-13 ENCOUNTER — Other Ambulatory Visit: Payer: Self-pay
